# Patient Record
Sex: Female | Born: 1965 | Race: White | Hispanic: No | Marital: Married | Smoking: Former smoker
Health system: Southern US, Community
[De-identification: ages and names within clinical notes are randomized; demographics above are authoritative.]

## PROBLEM LIST (undated history)

## (undated) DIAGNOSIS — F329 Major depressive disorder, single episode, unspecified: Secondary | ICD-10-CM

## (undated) DIAGNOSIS — F32A Depression, unspecified: Secondary | ICD-10-CM

## (undated) DIAGNOSIS — N912 Amenorrhea, unspecified: Secondary | ICD-10-CM

## (undated) DIAGNOSIS — R112 Nausea with vomiting, unspecified: Secondary | ICD-10-CM

## (undated) DIAGNOSIS — K219 Gastro-esophageal reflux disease without esophagitis: Secondary | ICD-10-CM

## (undated) DIAGNOSIS — M1990 Unspecified osteoarthritis, unspecified site: Secondary | ICD-10-CM

## (undated) DIAGNOSIS — E785 Hyperlipidemia, unspecified: Secondary | ICD-10-CM

## (undated) DIAGNOSIS — Z9889 Other specified postprocedural states: Secondary | ICD-10-CM

## (undated) HISTORY — DX: Depression, unspecified: F32.A

## (undated) HISTORY — PX: BREAST REDUCTION SURGERY: SHX8

## (undated) HISTORY — PX: CARPAL TUNNEL RELEASE: SHX101

## (undated) HISTORY — DX: Amenorrhea, unspecified: N91.2

## (undated) HISTORY — DX: Major depressive disorder, single episode, unspecified: F32.9

---

## 1999-06-14 ENCOUNTER — Other Ambulatory Visit: Admission: RE | Admit: 1999-06-14 | Discharge: 1999-06-14 | Payer: Self-pay | Admitting: Gynecology

## 1999-09-25 HISTORY — PX: TUBAL LIGATION: SHX77

## 2000-01-01 ENCOUNTER — Inpatient Hospital Stay (HOSPITAL_COMMUNITY): Admission: AD | Admit: 2000-01-01 | Discharge: 2000-01-03 | Payer: Self-pay | Admitting: Gynecology

## 2000-01-01 ENCOUNTER — Encounter (INDEPENDENT_AMBULATORY_CARE_PROVIDER_SITE_OTHER): Payer: Self-pay | Admitting: Specialist

## 2000-02-20 ENCOUNTER — Other Ambulatory Visit: Admission: RE | Admit: 2000-02-20 | Discharge: 2000-02-20 | Payer: Self-pay | Admitting: Gynecology

## 2002-09-16 ENCOUNTER — Other Ambulatory Visit: Admission: RE | Admit: 2002-09-16 | Discharge: 2002-09-16 | Payer: Self-pay | Admitting: Gynecology

## 2004-10-27 ENCOUNTER — Other Ambulatory Visit: Admission: RE | Admit: 2004-10-27 | Discharge: 2004-10-27 | Payer: Self-pay | Admitting: Obstetrics and Gynecology

## 2006-06-06 ENCOUNTER — Other Ambulatory Visit: Admission: RE | Admit: 2006-06-06 | Discharge: 2006-06-06 | Payer: Self-pay | Admitting: Obstetrics and Gynecology

## 2007-11-05 ENCOUNTER — Other Ambulatory Visit: Admission: RE | Admit: 2007-11-05 | Discharge: 2007-11-05 | Payer: Self-pay | Admitting: Obstetrics and Gynecology

## 2009-06-07 ENCOUNTER — Emergency Department (HOSPITAL_COMMUNITY): Admission: EM | Admit: 2009-06-07 | Discharge: 2009-06-07 | Payer: Self-pay | Admitting: Emergency Medicine

## 2011-02-09 NOTE — Discharge Summary (Signed)
[facility]  Patient:    Tammy Herman, Tammy Herman                      MRN: [id number] Adm. Date:  [id number] Disc. Date: [id number] Attending:  Tonye Royalty Dictator:   Antony Contras, [facility]                           Discharge Summary  DISCHARGE DIAGNOSES:              Intrauterine pregnancy at 37 weeks. Delivery of viable infant.  PROCEDURES:                       Mighty-Vac assisted vaginal delivery. Without hematomas or laceration.  Modified Pomeroy bilateral tubal ligation.  HISTORY OF PRESENT ILLNESS:       The patient is a 45 year old gravida 3, para 2-0-0-2 with an LMP April 18, 1999 and [place] Jan 23, 2000.  PRENATAL COURSE:                  Uncomplicated.  PRENATAL LABS:                    Blood type 0 positive.  Antibody screen negative.  Rubella positive.  RPR, HBSAG, HIV nonreactive.  NSAFP within normal limits.  Glucose within normal limits.  The patient did decline genetic amniocentesis.  GBS negative.  HOSPITAL COURSE AND TREATMENT:    The patient was admitted at 37 weeks with spontaneous rupture of membranes of clear fluid.  Cervix on admission was 1 cm and 50% nonassist station.  Pitocin augmentation was begun.  The patient progressed in labor to complete dilatation and was delivered vaginally with Mighty-Vac assistance.  APGAR 9,9 female infant, weight 6 pounds 2 ounces.  No episiotomy and no lacerations.  There was a ping-pong sized hematoma on the right labia.  Estimated blood loss was 500 cc.                                    On her second postpartum day the patient was taken to surgery for a bilateral tubal ligation, performed by Dr. Douglass Rivers under epidural anesthesia.  Findings included normal tubes. small pedunculated uterine fibroids.                                    Her postpartum course was within normal limits.  She remained afebrile and no difficulty voiding after delivery or surgery.  The patient can be  discharged on her third day, in satisfactory condition with her infant.  DISPOSITION:                      Follow-up in the office in six weeks. Continue with Motrin and prenatal vitamins and iron.  DISCHARGE LABS:                   Postpartum CBC -- Hematocrit 32.8, hemoglobin 11.2, WBC 15, platelets 354. DD:  01/19/00 TD:  01/23/00 Job: [id number]

## 2011-02-09 NOTE — Op Note (Signed)
[facility]  Patient:    Tammy Herman, Tammy Herman                      MRN: [id number] Proc. Date: 01/02/00 Adm. Date:  [id number] Attending:  Tonye Royalty                           Operative Report  PREOPERATIVE DIAGNOSIS:       Status post NSVD, undesired fertility.  POSTOPERATIVE DIAGNOSIS:      Status post NSVD, undesired fertility.  OPERATION:                    Modified Pomeroy bilateral tubal ligation.  SURGEON:                      Douglass Rivers, M.D.  ASSISTANT:  ANESTHESIA:                   Epidural.  ESTIMATED BLOOD LOSS:         Minimal.  FINDINGS:                     Normal tubes bilaterally and a small pedunculated  fibroid at the right fundus.  DESCRIPTION OF PROCEDURE:     The patient was taken to the operating room after  informed consent and adequate epidural anesthesia was ensured and prepped and draped in the usual sterile fashion.  An infraumbilical incision was made with he scalpel and carried through to the underlying layer of fascia which was scored n the midline.  The incision was extended laterally with the Mayo scissors.  An examining finger was placed into the incision.  The tubo-ovarian ligament was grasped with a Babcock.  The tube was visualized, elevated through the incision, and walked out to the fimbriated end.  Two free ties were threwn around the midportion of the tube which was excised.  The tube was noted to be hemostatic nd was returned back to the abdomen.  In a similar fashion, the tubo-ovarian ligament on the left was grasped, the tube was visualized, and the midportion of the left tube was also excised.  The tubes were sent off to pathology.  The fascia and peritoneum were closed with 0 Vicryl.  The skin was closed with 3-0 plain. Steri-Strips were placed.  8 cc of Marcaine was injected into the incision. The patient tolerated the procedure well.  Sponge, needle, and instrument  counts were correct x 2.  She was transferred to the PACU in stable condition. DD:  01/02/00 TD:  01/02/00 Job: [id number]

## 2012-08-24 HISTORY — PX: KNEE SURGERY: SHX244

## 2013-06-30 ENCOUNTER — Encounter: Payer: Self-pay | Admitting: Nurse Practitioner

## 2013-07-01 ENCOUNTER — Ambulatory Visit: Payer: Self-pay | Admitting: Gynecology

## 2013-07-27 ENCOUNTER — Ambulatory Visit (INDEPENDENT_AMBULATORY_CARE_PROVIDER_SITE_OTHER): Payer: 59 | Admitting: Gynecology

## 2013-07-27 ENCOUNTER — Encounter: Payer: Self-pay | Admitting: Gynecology

## 2013-07-27 VITALS — BP 120/70 | HR 74 | Resp 16 | Ht 64.25 in | Wt 203.0 lb

## 2013-07-27 DIAGNOSIS — Z0000 Encounter for general adult medical examination without abnormal findings: Secondary | ICD-10-CM

## 2013-07-27 DIAGNOSIS — R635 Abnormal weight gain: Secondary | ICD-10-CM

## 2013-07-27 DIAGNOSIS — E7800 Pure hypercholesterolemia, unspecified: Secondary | ICD-10-CM | POA: Insufficient documentation

## 2013-07-27 DIAGNOSIS — Z01419 Encounter for gynecological examination (general) (routine) without abnormal findings: Secondary | ICD-10-CM

## 2013-07-27 LAB — POCT URINALYSIS DIPSTICK
Bilirubin, UA: NEGATIVE
Blood, UA: NEGATIVE
Glucose, UA: NEGATIVE
Ketones, UA: NEGATIVE
Leukocytes, UA: NEGATIVE
Nitrite, UA: NEGATIVE
Protein, UA: NEGATIVE
Urobilinogen, UA: NEGATIVE
pH, UA: 5

## 2013-07-27 LAB — TSH: TSH: 0.823 u[IU]/mL (ref 0.350–4.500)

## 2013-07-27 LAB — HEMOGLOBIN, FINGERSTICK: Hemoglobin, fingerstick: 12.9 g/dL (ref 12.0–16.0)

## 2013-07-27 NOTE — Patient Instructions (Signed)

## 2013-07-27 NOTE — Progress Notes (Signed)
47 y.o. MarriedCaucasian female   [phone] here for annual exam. Pt is currently sexually active.  No cycles since 11/2012,  Had hot flashes earlier but getting better.  No vaginal dryness.  Pt reports noticing blood with wiping only last month, red.   Pt was treated with provera last year due to prolonged absent menses and heavy cycles, pt states no benefit so stopped provera.  Pt has gained 20# since last office visit, does regular exercise.  Pt reports some bloating for 22m or so.  Patient's last menstrual period was 11/22/2012.          Sexually active: yes  The current method of family planning is tubal ligation.    Exercising: yes  Gym/ health club routine includes stationary bike and yoga. 3-4 days a weeek Last pap: 04/22/12 normal, neg HRHPV Alcohol: 5-6 glasses of wine a week Tobacco: quit smoking 7 years ago BSE:no Mammogram:      Health Maintenance  Topic Date Due  . Pap Smear  10/02/1983  . Tetanus/tdap  10/01/1984  . Influenza Vaccine  04/24/2013    Family History  Problem Relation Age of Onset  . Hypertension Mother   . Diabetes Father     Type 2  . Diabetes Sister     Type 2    There are no active problems to display for this patient.   Past Medical History  Diagnosis Date  . Depression   . Indigestion   . Amenorrhea     Past Surgical History  Procedure Laterality Date  . Tubal ligation  2001  . Knee surgery Left 08/2012    Allergies: Review of patient's allergies indicates no known allergies.  Current Outpatient Prescriptions  Medication Sig Dispense Refill  . rosuvastatin (CRESTOR) 10 MG tablet Take 10 mg by mouth daily.       No current facility-administered medications for this visit.    ROS: Pertinent items are noted in HPI.  Exam:    BP 120/70  Pulse 74  Resp 16  Ht 5' 4.25" (1.632 m)  Wt 203 lb (92.08 kg)  BMI 34.57 kg/m2  LMP 11/22/2012 Weight change: @WEIGHTCHANGE @ Last 3 height recordings:  Ht Readings from Last 3 Encounters:   07/27/13 5' 4.25" (1.632 m)   General appearance: alert, cooperative and appears stated age Head: Normocephalic, without obvious abnormality, atraumatic Neck: no adenopathy, no carotid bruit, no JVD, supple, symmetrical, trachea midline and thyroid not enlarged, symmetric, no tenderness/mass/nodules Lungs: clear to auscultation bilaterally Breasts: normal appearance, no masses or tenderness Heart: regular rate and rhythm, S1, S2 normal, no murmur, click, rub or gallop Abdomen: soft, non-tender; bowel sounds normal; no masses,  no organomegaly Extremities: extremities normal, atraumatic, no cyanosis or edema Skin: Skin color, texture, turgor normal. No rashes or lesions Lymph nodes: Cervical, supraclavicular, and axillary nodes normal. no inguinal nodes palpated Neurologic: Grossly normal   Pelvic: External genitalia:  no lesions              Urethra: normal appearing urethra with no masses, tenderness or lesions              Bartholins and Skenes: normal                 Vagina: normal appearing vagina with normal color and discharge, no lesions              Cervix: normal appearance              Pap taken: no  Bimanual Exam:  Uterus:  uterus is normal size, shape, consistency and nontender                                      Adnexa:    normal adnexa in size, nontender and no masses                                      Rectovaginal: Confirms                                      Anus:  normal sphincter tone, no lesions  A: well woman Peri-menopause Weight gain     P: mammogram annual pap smear not done TSH counseled on breast self exam, mammography screening, adequate intake of calcium and vitamin D, diet and exercise return annually or prn   An After Visit Summary was printed and given to the patient.

## 2013-07-30 ENCOUNTER — Other Ambulatory Visit: Payer: Self-pay

## 2014-07-26 ENCOUNTER — Encounter: Payer: Self-pay | Admitting: Gynecology

## 2014-09-03 ENCOUNTER — Telehealth: Payer: 59 | Admitting: Family

## 2014-09-03 DIAGNOSIS — J019 Acute sinusitis, unspecified: Secondary | ICD-10-CM

## 2014-09-03 MED ORDER — AMOXICILLIN-POT CLAVULANATE 875-125 MG PO TABS
1.0000 | ORAL_TABLET | Freq: Two times a day (BID) | ORAL | Status: DC
Start: 2014-09-03 — End: 2014-10-19

## 2014-09-03 NOTE — Progress Notes (Signed)

## 2014-10-06 NOTE — H&P (Signed)
UNICOMPARTMENTAL KNEE ADMISSION H&P  Patient is being admitted for right knee lateral menisectomy and medial unicompartmental arthroplasty.  Subjective:  Chief Complaint:     Right knee LMT and medial compartment primary OA / pain.  HPI: Juli Odom Fennimore, 49 y.o. female, has a history of pain and functional disability in the right knee due to arthritis and has failed non-surgical conservative treatments for greater than 12 weeks to include NSAID's and/or analgesics, corticosteriod injections and activity modification.  Onset of symptoms was gradual, starting 1+ years ago with gradually worsening course since that time. The patient noted no past surgery on the right knee(s).  Patient currently rates pain in the right knee(s) at 10 out of 10 with activity. Patient has night pain, worsening of pain with activity and weight bearing, pain that interferes with activities of daily living, pain with passive range of motion, crepitus and joint swelling.  Patient has evidence of periarticular osteophytes and joint space narrowing of the medial compartment by imaging studies. MRI also reveals right knee lateral meniscal tear.  There is no active infection.   Risks, benefits and expectations were discussed with the patient.  Risks including but not limited to the risk of anesthesia, blood clots, nerve damage, blood vessel damage, failure of the prosthesis, infection and up to and including death.  Patient understand the risks, benefits and expectations and wishes to proceed with surgery.   PCP: Tivis Ringer, MD  D/C Plans:      Home with HHPT  Post-op Meds:       No Rx given   Tranexamic Acid:      To be given - IV   Decadron:      Is to be given  FYI:     ASA post-op  Norco post-op    Patient Active Problem List   Diagnosis Date Noted  . Pure hypercholesterolemia 07/27/2013   Past Medical History  Diagnosis Date  . Depression   . Indigestion   . Amenorrhea     Past Surgical History   Procedure Laterality Date  . Tubal ligation  2001  . Knee surgery Left 08/2012    No prescriptions prior to admission   No Known Allergies   History  Substance Use Topics  . Smoking status: Former Smoker    Quit date: 07/27/2006  . Smokeless tobacco: Never Used  . Alcohol Use: 2.5 - 3.0 oz/week    5-6 drink(s) per week     Comment: 5-6 glasses of wine a week    Family History  Problem Relation Age of Onset  . Hypertension Mother   . Diabetes Father     Type 2  . Diabetes Sister     Type 2     Review of Systems  Constitutional: Negative.   HENT: Negative.   Eyes: Negative.   Respiratory: Negative.   Cardiovascular: Negative.   Gastrointestinal: Positive for heartburn.  Genitourinary: Negative.   Musculoskeletal: Positive for joint pain.  Skin: Negative.   Neurological: Negative.   Endo/Heme/Allergies: Negative.   Psychiatric/Behavioral: Positive for depression.    Objective:  Physical Exam  Constitutional: She is oriented to person, place, and time. She appears well-developed and well-nourished.  HENT:  Head: Normocephalic and atraumatic.  Mouth/Throat: Oropharynx is clear and moist.  Eyes: Pupils are equal, round, and reactive to light.  Neck: Neck supple. No JVD present. No tracheal deviation present. No thyromegaly present.  Cardiovascular: Normal rate, regular rhythm, normal heart sounds and intact distal pulses.  Respiratory: Effort normal and breath sounds normal. No stridor. No respiratory distress. She has no wheezes.  GI: Soft. There is no tenderness. There is no guarding.  Musculoskeletal:       Right knee: She exhibits decreased range of motion, swelling and bony tenderness. She exhibits no ecchymosis, no deformity, no laceration and no erythema. Tenderness found. Medial joint line and lateral joint line tenderness noted.  Lymphadenopathy:    She has no cervical adenopathy.  Neurological: She is alert and oriented to person, place, and time.   Skin: Skin is warm and dry.  Psychiatric: She has a normal mood and affect.     Labs:  Estimated body mass index is 34.57 kg/(m^2) as calculated from the following:   Height as of 07/27/13: 5' 4.25" (1.632 m).   Weight as of 07/27/13: 92.08 kg (203 lb).   Imaging Review Plain radiographs demonstrate severe degenerative joint disease of the right knee medial compartment. MRI also reveals LMT.  The overall alignment is neutral. The bone quality appears to be good for age and reported activity level.  Assessment/Plan:  End stage arthritis, right knee   The patient history, physical examination, clinical judgment of the provider and imaging studies are consistent with end stage degenerative joint disease and lateral meniscal tear of the right knee and knee arthroscopy with lateral menisectomy and medial unicompartmental knee arthroplasty is deemed medically necessary. The treatment options including medical management, injection therapy arthroscopy and arthroplasty were discussed at length. The risks and benefits of total knee arthroplasty were presented and reviewed. The risks due to aseptic loosening, infection, stiffness, patella tracking problems, thromboembolic complications and other imponderables were discussed. The patient acknowledged the explanation, agreed to proceed with the plan and consent was signed. Patient is being admitted for observation treatment for surgery, pain control, PT, OT, prophylactic antibiotics, VTE prophylaxis, progressive ambulation and ADL's and discharge planning. The patient is planning to be discharged home with home health services.       West Pugh Tareq Dwan   PA-C  10/06/2014, 12:45 PM

## 2014-10-11 NOTE — Patient Instructions (Addendum)
Lalania Haseman Buser  10/11/2014   Your procedure is scheduled on: [id number]    Report to [facility] Main  Entrance and follow signs to               [place] at       [place] AM.  Call this number if you have problems the morning of surgery [phone]   Remember:  Do not eat food or drink liquids :After Midnight.     Take these medicines the morning of surgery with A SIP OF WATER: Zantac, Effexor, Hydrocodone                                You may not have any metal on your body including hair pins and              piercings  Do not wear jewelry, make-up, lotions, powders or perfumes., deodorant             Do not wear nail polish.  Do not shave  48 hours prior to surgery.                 Do not bring valuables to the hospital. [place].  Contacts, dentures or bridgework may not be worn into surgery.  Leave suitcase in the car. After surgery it may be brought to your room.         Special Instructions:coughing and deep breathing exercises, leg exercises               Please read over the following fact sheets you were given: _____________________________________________________________________             [facility] - Preparing for Surgery Before surgery, you can play an important role.  Because skin is not sterile, your skin needs to be as free of germs as possible.  You can reduce the number of germs on your skin by washing with CHG (chlorahexidine gluconate) soap before surgery.  CHG is an antiseptic cleaner which kills germs and bonds with the skin to continue killing germs even after washing. Please DO NOT use if you have an allergy to CHG or antibacterial soaps.  If your skin becomes reddened/irritated stop using the CHG and inform your nurse when you arrive at Short Stay. Do not shave (including legs and underarms) for at least 48 hours prior to the first CHG shower.  You may shave your  face/neck. Please follow these instructions carefully:  1.  Shower with CHG Soap the night before surgery and the  morning of Surgery.  2.  If you choose to wash your hair, wash your hair first as usual with your  normal  shampoo.  3.  After you shampoo, rinse your hair and body thoroughly to remove the  shampoo.                           4.  Use CHG as you would any other liquid soap.  You can apply chg directly  to the skin and wash                       Gently with a scrungie or clean washcloth.  5.  Apply the CHG Soap to your body ONLY FROM THE NECK DOWN.   Do not use on face/ open                           Wound or open sores. Avoid contact with eyes, ears mouth and genitals (private parts).                       Wash face,  Genitals (private parts) with your normal soap.             6.  Wash thoroughly, paying special attention to the area where your surgery  will be performed.  7.  Thoroughly rinse your body with warm water from the neck down.  8.  DO NOT shower/wash with your normal soap after using and rinsing off  the CHG Soap.                9.  Pat yourself dry with a clean towel.            10.  Wear clean pajamas.            11.  Place clean sheets on your bed the night of your first shower and do not  sleep with pets. Day of Surgery : Do not apply any lotions/deodorants the morning of surgery.  Please wear clean clothes to the hospital/surgery center.  FAILURE TO FOLLOW THESE INSTRUCTIONS MAY RESULT IN THE CANCELLATION OF YOUR SURGERY PATIENT SIGNATURE_________________________________  NURSE SIGNATURE__________________________________  ________________________________________________________________________  WHAT IS A BLOOD TRANSFUSION? Blood Transfusion Information  A transfusion is the replacement of blood or some of its parts. Blood is made up of multiple cells which provide different functions.  Red blood cells carry oxygen and are used for blood loss  replacement.  White blood cells fight against infection.  Platelets control bleeding.  Plasma helps clot blood.  Other blood products are available for specialized needs, such as hemophilia or other clotting disorders. BEFORE THE TRANSFUSION  Who gives blood for transfusions?   Healthy volunteers who are fully evaluated to make sure their blood is safe. This is blood bank blood. Transfusion therapy is the safest it has ever been in the practice of medicine. Before blood is taken from a donor, a complete history is taken to make sure that person has no history of diseases nor engages in risky social behavior (examples are intravenous drug use or sexual activity with multiple partners). The donor's travel history is screened to minimize risk of transmitting infections, such as malaria. The donated blood is tested for signs of infectious diseases, such as HIV and hepatitis. The blood is then tested to be sure it is compatible with you in order to minimize the chance of a transfusion reaction. If you or a relative donates blood, this is often done in anticipation of surgery and is not appropriate for emergency situations. It takes many days to process the donated blood. RISKS AND COMPLICATIONS Although transfusion therapy is very safe and saves many lives, the main dangers of transfusion include:  1. Getting an infectious disease. 2. Developing a transfusion reaction. This is an allergic reaction to something in the blood you were given. Every precaution is taken to prevent this. The decision to have a blood transfusion has been considered carefully by your caregiver before blood is given. Blood is not given unless the benefits outweigh the risks. AFTER THE TRANSFUSION  Right after  receiving a blood transfusion, you will usually feel much better and more energetic. This is especially true if your red blood cells have gotten low (anemic). The transfusion raises the level of the red blood cells which  carry oxygen, and this usually causes an energy increase.  The nurse administering the transfusion will monitor you carefully for complications. HOME CARE INSTRUCTIONS  No special instructions are needed after a transfusion. You may find your energy is better. Speak with your caregiver about any limitations on activity for underlying diseases you may have. SEEK MEDICAL CARE IF:   Your condition is not improving after your transfusion.  You develop redness or irritation at the intravenous (IV) site. SEEK IMMEDIATE MEDICAL CARE IF:  Any of the following symptoms occur over the next 12 hours:  Shaking chills.  You have a temperature by mouth above 102 F (38.9 C), not controlled by medicine.  Chest, back, or muscle pain.  People around you feel you are not acting correctly or are confused.  Shortness of breath or difficulty breathing.  Dizziness and fainting.  You get a rash or develop hives.  You have a decrease in urine output.  Your urine turns a dark color or changes to pink, red, or brown. Any of the following symptoms occur over the next 10 days:  You have a temperature by mouth above 102 F (38.9 C), not controlled by medicine.  Shortness of breath.  Weakness after normal activity.  The white part of the eye turns yellow (jaundice).  You have a decrease in the amount of urine or are urinating less often.  Your urine turns a dark color or changes to pink, red, or brown. Document Released: 09/07/2000 Document Revised: 12/03/2011 Document Reviewed: 04/26/2008 ExitCare Patient Information 2014 [place].  _______________________________________________________________________  Incentive Spirometer  An incentive spirometer is a tool that can help keep your lungs clear and active. This tool measures how well you are filling your lungs with each breath. Taking long deep breaths may help reverse or decrease the chance of developing breathing (pulmonary) problems  (especially infection) following:  A long period of time when you are unable to move or be active. BEFORE THE PROCEDURE   If the spirometer includes an indicator to show your best effort, your nurse or respiratory therapist will set it to a desired goal.  If possible, sit up straight or lean slightly forward. Try not to slouch.  Hold the incentive spirometer in an upright position. INSTRUCTIONS FOR USE  3. Sit on the edge of your bed if possible, or sit up as far as you can in bed or on a chair. 4. Hold the incentive spirometer in an upright position. 5. Breathe out normally. 6. Place the mouthpiece in your mouth and seal your lips tightly around it. 7. Breathe in slowly and as deeply as possible, raising the piston or the ball toward the top of the column. 8. Hold your breath for 3-5 seconds or for as long as possible. Allow the piston or ball to fall to the bottom of the column. 9. Remove the mouthpiece from your mouth and breathe out normally. 10. Rest for a few seconds and repeat Steps 1 through 7 at least 10 times every 1-2 hours when you are awake. Take your time and take a few normal breaths between deep breaths. 11. The spirometer may include an indicator to show your best effort. Use the indicator as a goal to work toward during each repetition. 12. After each set  of 10 deep breaths, practice coughing to be sure your lungs are clear. If you have an incision (the cut made at the time of surgery), support your incision when coughing by placing a pillow or rolled up towels firmly against it. Once you are able to get out of bed, walk around indoors and cough well. You may stop using the incentive spirometer when instructed by your caregiver.  RISKS AND COMPLICATIONS  Take your time so you do not get dizzy or light-headed.  If you are in pain, you may need to take or ask for pain medication before doing incentive spirometry. It is harder to take a deep breath if you are having  pain. AFTER USE  Rest and breathe slowly and easily.  It can be helpful to keep track of a log of your progress. Your caregiver can provide you with a simple table to help with this. If you are using the spirometer at home, follow these instructions: [place] IF:   You are having difficultly using the spirometer.  You have trouble using the spirometer as often as instructed.  Your pain medication is not giving enough relief while using the spirometer.  You develop fever of 100.5 F (38.1 C) or higher. SEEK IMMEDIATE MEDICAL CARE IF:   You cough up bloody sputum that had not been present before.  You develop fever of 102 F (38.9 C) or greater.  You develop worsening pain at or near the incision site. MAKE SURE YOU:   Understand these instructions.  Will watch your condition.  Will get help right away if you are not doing well or get worse. Document Released: 01/21/2007 Document Revised: 12/03/2011 Document Reviewed: 03/24/2007 [facility] Patient Information 2014 [place], [place].   ________________________________________________________________________

## 2014-10-13 ENCOUNTER — Encounter (HOSPITAL_COMMUNITY): Payer: Self-pay

## 2014-10-13 ENCOUNTER — Encounter (HOSPITAL_COMMUNITY)
Admission: RE | Admit: 2014-10-13 | Discharge: 2014-10-13 | Disposition: A | Discharge status: Home / Self Care | Payer: Commercial Managed Care - PPO | Source: Ambulatory Visit | Attending: Orthopedic Surgery | Admitting: Orthopedic Surgery

## 2014-10-13 DIAGNOSIS — S83281A Other tear of lateral meniscus, current injury, right knee, initial encounter: Secondary | ICD-10-CM | POA: Insufficient documentation

## 2014-10-13 DIAGNOSIS — M179 Osteoarthritis of knee, unspecified: Secondary | ICD-10-CM | POA: Diagnosis not present

## 2014-10-13 DIAGNOSIS — Z01818 Encounter for other preprocedural examination: Secondary | ICD-10-CM | POA: Insufficient documentation

## 2014-10-13 HISTORY — DX: Gastro-esophageal reflux disease without esophagitis: K21.9

## 2014-10-13 HISTORY — DX: Unspecified osteoarthritis, unspecified site: M19.90

## 2014-10-13 LAB — CBC
HCT: 42.2 % (ref 36.0–46.0)
Hemoglobin: 13.5 g/dL (ref 12.0–15.0)
MCH: 29.5 pg (ref 26.0–34.0)
MCHC: 32 g/dL (ref 30.0–36.0)
MCV: 92.1 fL (ref 78.0–100.0)
Platelets: 332 10*3/uL (ref 150–400)
RBC: 4.58 MIL/uL (ref 3.87–5.11)
RDW: 13.8 % (ref 11.5–15.5)
WBC: 6.2 10*3/uL (ref 4.0–10.5)

## 2014-10-13 LAB — URINALYSIS, ROUTINE W REFLEX MICROSCOPIC
Bilirubin Urine: NEGATIVE
Glucose, UA: NEGATIVE mg/dL
Hgb urine dipstick: NEGATIVE
Ketones, ur: NEGATIVE mg/dL
Nitrite: NEGATIVE
Protein, ur: NEGATIVE mg/dL
Specific Gravity, Urine: 1.03 (ref 1.005–1.030)
Urobilinogen, UA: 0.2 mg/dL (ref 0.0–1.0)
pH: 5 (ref 5.0–8.0)

## 2014-10-13 LAB — PROTIME-INR
INR: 0.94 (ref 0.00–1.49)
Prothrombin Time: 12.6 seconds (ref 11.6–15.2)

## 2014-10-13 LAB — SURGICAL PCR SCREEN
MRSA, PCR: NEGATIVE
Staphylococcus aureus: POSITIVE — AB

## 2014-10-13 LAB — URINE MICROSCOPIC-ADD ON

## 2014-10-13 LAB — BASIC METABOLIC PANEL
Anion gap: 7 (ref 5–15)
BUN: 14 mg/dL (ref 6–23)
CO2: 25 mmol/L (ref 19–32)
Calcium: 8.9 mg/dL (ref 8.4–10.5)
Chloride: 104 mEq/L (ref 96–112)
Creatinine, Ser: 0.81 mg/dL (ref 0.50–1.10)
GFR calc Af Amer: >90 mL/min (ref >90)
GFR calc non Af Amer: 84 mL/min — ABNORMAL LOW (ref >90)
Glucose, Bld: 85 mg/dL (ref 70–99)
Potassium: 4.5 mmol/L (ref 3.5–5.1)
Sodium: 136 mmol/L (ref 135–145)

## 2014-10-13 LAB — PREGNANCY, URINE: Preg Test, Ur: NEGATIVE

## 2014-10-13 LAB — APTT: aPTT: 30 seconds (ref 24–37)

## 2014-10-13 LAB — ABO/RH: ABO/RH(D): O POS

## 2014-10-18 ENCOUNTER — Ambulatory Visit (HOSPITAL_COMMUNITY): Payer: Commercial Managed Care - PPO | Admitting: Certified Registered"

## 2014-10-18 ENCOUNTER — Observation Stay (HOSPITAL_COMMUNITY)
Admission: RE | Admit: 2014-10-18 | Discharge: 2014-10-19 | Disposition: A | Discharge status: Home / Self Care | Payer: Commercial Managed Care - PPO | Source: Ambulatory Visit | Attending: Orthopedic Surgery | Admitting: Orthopedic Surgery

## 2014-10-18 ENCOUNTER — Encounter (HOSPITAL_COMMUNITY): Payer: Self-pay | Admitting: Certified Registered"

## 2014-10-18 ENCOUNTER — Encounter (HOSPITAL_COMMUNITY)
Admission: RE | Disposition: A | Discharge status: Home / Self Care | Payer: Self-pay | Source: Ambulatory Visit | Attending: Orthopedic Surgery

## 2014-10-18 DIAGNOSIS — X58XXXA Exposure to other specified factors, initial encounter: Secondary | ICD-10-CM | POA: Diagnosis not present

## 2014-10-18 DIAGNOSIS — M1711 Unilateral primary osteoarthritis, right knee: Secondary | ICD-10-CM | POA: Diagnosis not present

## 2014-10-18 DIAGNOSIS — Y9389 Activity, other specified: Secondary | ICD-10-CM | POA: Insufficient documentation

## 2014-10-18 DIAGNOSIS — Y998 Other external cause status: Secondary | ICD-10-CM | POA: Diagnosis not present

## 2014-10-18 DIAGNOSIS — K219 Gastro-esophageal reflux disease without esophagitis: Secondary | ICD-10-CM | POA: Diagnosis not present

## 2014-10-18 DIAGNOSIS — Z9889 Other specified postprocedural states: Secondary | ICD-10-CM

## 2014-10-18 DIAGNOSIS — Y9289 Other specified places as the place of occurrence of the external cause: Secondary | ICD-10-CM | POA: Insufficient documentation

## 2014-10-18 DIAGNOSIS — E780 Pure hypercholesterolemia: Secondary | ICD-10-CM | POA: Insufficient documentation

## 2014-10-18 DIAGNOSIS — Z96659 Presence of unspecified artificial knee joint: Secondary | ICD-10-CM

## 2014-10-18 DIAGNOSIS — Z87891 Personal history of nicotine dependence: Secondary | ICD-10-CM | POA: Insufficient documentation

## 2014-10-18 DIAGNOSIS — S83281A Other tear of lateral meniscus, current injury, right knee, initial encounter: Secondary | ICD-10-CM | POA: Diagnosis not present

## 2014-10-18 DIAGNOSIS — Z96651 Presence of right artificial knee joint: Secondary | ICD-10-CM

## 2014-10-18 DIAGNOSIS — F329 Major depressive disorder, single episode, unspecified: Secondary | ICD-10-CM | POA: Insufficient documentation

## 2014-10-18 HISTORY — PX: PARTIAL KNEE ARTHROPLASTY: SHX2174

## 2014-10-18 HISTORY — PX: KNEE ARTHROSCOPY WITH LATERAL MENISECTOMY: SHX6193

## 2014-10-18 LAB — TYPE AND SCREEN
ABO/RH(D): O POS
Antibody Screen: NEGATIVE

## 2014-10-18 SURGERY — ARTHROSCOPY, KNEE, WITH LATERAL MENISCECTOMY
Anesthesia: Spinal | Site: Knee | Laterality: Right

## 2014-10-18 MED ORDER — ALUM & MAG HYDROXIDE-SIMETH 200-200-20 MG/5ML PO SUSP: 30.0000 mL | ORAL | Status: DC | PRN

## 2014-10-18 MED ORDER — FERROUS SULFATE 325 (65 FE) MG PO TABS
325.0000 mg | ORAL_TABLET | Freq: Three times a day (TID) | ORAL | Status: DC
  Filled 2014-10-18 (×5): qty 1

## 2014-10-18 MED ORDER — METHOCARBAMOL 500 MG PO TABS
500.0000 mg | ORAL_TABLET | Freq: Four times a day (QID) | ORAL | Status: DC | PRN
  Administered 2014-10-18 – 2014-10-19 (×3): 500 mg via ORAL
  Filled 2014-10-18 (×3): qty 1

## 2014-10-18 MED ORDER — BUPIVACAINE-EPINEPHRINE 0.25% -1:200000 IJ SOLN
INTRAMUSCULAR | Status: DC | PRN
  Administered 2014-10-18: 30 mL

## 2014-10-18 MED ORDER — LIDOCAINE HCL (CARDIAC) 20 MG/ML IV SOLN
INTRAVENOUS | Status: DC | PRN
  Administered 2014-10-18: 50 mg via INTRAVENOUS

## 2014-10-18 MED ORDER — TRAZODONE HCL 100 MG PO TABS
100.0000 mg | ORAL_TABLET | Freq: Every day | ORAL | Status: DC
  Administered 2014-10-18: 100 mg via ORAL
  Filled 2014-10-18 (×2): qty 1

## 2014-10-18 MED ORDER — LIDOCAINE HCL (CARDIAC) 20 MG/ML IV SOLN
INTRAVENOUS | Status: AC
  Filled 2014-10-18: qty 5

## 2014-10-18 MED ORDER — SODIUM CHLORIDE 0.9 % IV SOLN
INTRAVENOUS | Status: DC
  Administered 2014-10-18 – 2014-10-19 (×2): via INTRAVENOUS
  Filled 2014-10-18 (×5): qty 1000

## 2014-10-18 MED ORDER — PROMETHAZINE HCL 25 MG/ML IJ SOLN: 6.2500 mg | INTRAMUSCULAR | Status: DC | PRN

## 2014-10-18 MED ORDER — MEPERIDINE HCL 50 MG/ML IJ SOLN: 6.2500 mg | INTRAMUSCULAR | Status: DC | PRN

## 2014-10-18 MED ORDER — SODIUM CHLORIDE 0.9 % IJ SOLN
INTRAMUSCULAR | Status: DC | PRN
  Administered 2014-10-18: 30 mL

## 2014-10-18 MED ORDER — MENTHOL 3 MG MT LOZG
1.0000 | LOZENGE | OROMUCOSAL | Status: DC | PRN
  Filled 2014-10-18: qty 9

## 2014-10-18 MED ORDER — CELECOXIB 200 MG PO CAPS
200.0000 mg | ORAL_CAPSULE | Freq: Two times a day (BID) | ORAL | Status: DC
  Administered 2014-10-18 – 2014-10-19 (×2): 200 mg via ORAL
  Filled 2014-10-18 (×3): qty 1

## 2014-10-18 MED ORDER — KETOROLAC TROMETHAMINE 30 MG/ML IJ SOLN
INTRAMUSCULAR | Status: DC | PRN
  Administered 2014-10-18: 30 mg

## 2014-10-18 MED ORDER — 0.9 % SODIUM CHLORIDE (POUR BTL) OPTIME
TOPICAL | Status: DC | PRN
  Administered 2014-10-18: 1000 mL

## 2014-10-18 MED ORDER — CEFAZOLIN SODIUM-DEXTROSE 2-3 GM-% IV SOLR
INTRAVENOUS | Status: AC
  Filled 2014-10-18: qty 50

## 2014-10-18 MED ORDER — MIDAZOLAM HCL 5 MG/5ML IJ SOLN
INTRAMUSCULAR | Status: DC | PRN
  Administered 2014-10-18 (×2): 1 mg via INTRAVENOUS
  Administered 2014-10-18: 2 mg via INTRAVENOUS

## 2014-10-18 MED ORDER — DEXAMETHASONE SODIUM PHOSPHATE 10 MG/ML IJ SOLN
INTRAMUSCULAR | Status: AC
  Filled 2014-10-18: qty 1

## 2014-10-18 MED ORDER — FENTANYL CITRATE 0.05 MG/ML IJ SOLN
INTRAMUSCULAR | Status: AC
  Filled 2014-10-18: qty 2

## 2014-10-18 MED ORDER — PROPOFOL INFUSION 10 MG/ML OPTIME
INTRAVENOUS | Status: DC | PRN
  Administered 2014-10-18: 120 ug/kg/min via INTRAVENOUS

## 2014-10-18 MED ORDER — PROPOFOL 10 MG/ML IV BOLUS
INTRAVENOUS | Status: AC
  Filled 2014-10-18: qty 20

## 2014-10-18 MED ORDER — BUPIVACAINE IN DEXTROSE 0.75-8.25 % IT SOLN
INTRATHECAL | Status: DC | PRN
  Administered 2014-10-18: 1.8 mL via INTRATHECAL

## 2014-10-18 MED ORDER — SODIUM CHLORIDE 0.9 % IJ SOLN
INTRAMUSCULAR | Status: AC
  Filled 2014-10-18: qty 50

## 2014-10-18 MED ORDER — FENTANYL CITRATE 0.05 MG/ML IJ SOLN
INTRAMUSCULAR | Status: DC | PRN
  Administered 2014-10-18: 100 ug via INTRAVENOUS

## 2014-10-18 MED ORDER — MIDAZOLAM HCL 2 MG/2ML IJ SOLN
INTRAMUSCULAR | Status: AC
  Filled 2014-10-18: qty 2

## 2014-10-18 MED ORDER — ONDANSETRON HCL 4 MG/2ML IJ SOLN
INTRAMUSCULAR | Status: DC | PRN
  Administered 2014-10-18: 4 mg via INTRAVENOUS

## 2014-10-18 MED ORDER — PHENOL 1.4 % MT LIQD
1.0000 | OROMUCOSAL | Status: DC | PRN
  Filled 2014-10-18: qty 177

## 2014-10-18 MED ORDER — CHLORHEXIDINE GLUCONATE 4 % EX LIQD: 60.0000 mL | Freq: Once | CUTANEOUS | Status: DC

## 2014-10-18 MED ORDER — CEFAZOLIN SODIUM-DEXTROSE 2-3 GM-% IV SOLR
2.0000 g | Freq: Four times a day (QID) | INTRAVENOUS | Status: AC
  Administered 2014-10-18 (×2): 2 g via INTRAVENOUS
  Filled 2014-10-18 (×2): qty 50

## 2014-10-18 MED ORDER — LACTATED RINGERS IR SOLN
Status: DC | PRN
  Administered 2014-10-18 (×2): 3000 mL

## 2014-10-18 MED ORDER — ONDANSETRON HCL 4 MG/2ML IJ SOLN
4.0000 mg | Freq: Four times a day (QID) | INTRAMUSCULAR | Status: DC | PRN
  Administered 2014-10-18: 4 mg via INTRAVENOUS
  Filled 2014-10-18: qty 2

## 2014-10-18 MED ORDER — VENLAFAXINE HCL 75 MG PO TABS
75.0000 mg | ORAL_TABLET | Freq: Every morning | ORAL | Status: DC
  Administered 2014-10-18 – 2014-10-19 (×2): 75 mg via ORAL
  Filled 2014-10-18 (×2): qty 1

## 2014-10-18 MED ORDER — LACTATED RINGERS IV SOLN: INTRAVENOUS | Status: DC

## 2014-10-18 MED ORDER — ONDANSETRON HCL 4 MG PO TABS: 4.0000 mg | ORAL_TABLET | Freq: Four times a day (QID) | ORAL | Status: DC | PRN

## 2014-10-18 MED ORDER — HYDROMORPHONE HCL 1 MG/ML IJ SOLN: 0.2500 mg | INTRAMUSCULAR | Status: DC | PRN

## 2014-10-18 MED ORDER — DIPHENHYDRAMINE HCL 25 MG PO CAPS: 25.0000 mg | ORAL_CAPSULE | Freq: Four times a day (QID) | ORAL | Status: DC | PRN

## 2014-10-18 MED ORDER — FAMOTIDINE 20 MG PO TABS
20.0000 mg | ORAL_TABLET | Freq: Every day | ORAL | Status: DC
Start: 2014-10-19 — End: 2014-10-19
  Filled 2014-10-18: qty 1

## 2014-10-18 MED ORDER — HYDROMORPHONE HCL 1 MG/ML IJ SOLN
0.5000 mg | INTRAMUSCULAR | Status: DC | PRN
  Administered 2014-10-18 (×3): 1 mg via INTRAVENOUS
  Filled 2014-10-18 (×3): qty 1

## 2014-10-18 MED ORDER — ROSUVASTATIN CALCIUM 10 MG PO TABS
10.0000 mg | ORAL_TABLET | Freq: Every day | ORAL | Status: DC
  Administered 2014-10-18 – 2014-10-19 (×2): 10 mg via ORAL
  Filled 2014-10-18 (×2): qty 1

## 2014-10-18 MED ORDER — DEXAMETHASONE SODIUM PHOSPHATE 10 MG/ML IJ SOLN: 10.0000 mg | Freq: Once | INTRAMUSCULAR | Status: DC

## 2014-10-18 MED ORDER — METHOCARBAMOL 1000 MG/10ML IJ SOLN
500.0000 mg | Freq: Four times a day (QID) | INTRAVENOUS | Status: DC | PRN
  Filled 2014-10-18: qty 5

## 2014-10-18 MED ORDER — POLYETHYLENE GLYCOL 3350 17 G PO PACK
17.0000 g | PACK | Freq: Two times a day (BID) | ORAL | Status: DC
  Administered 2014-10-18 – 2014-10-19 (×2): 17 g via ORAL

## 2014-10-18 MED ORDER — DEXAMETHASONE SODIUM PHOSPHATE 10 MG/ML IJ SOLN
10.0000 mg | Freq: Once | INTRAMUSCULAR | Status: AC
  Administered 2014-10-18: 10 mg via INTRAVENOUS

## 2014-10-18 MED ORDER — METOCLOPRAMIDE HCL 5 MG/ML IJ SOLN: 5.0000 mg | Freq: Three times a day (TID) | INTRAMUSCULAR | Status: DC | PRN

## 2014-10-18 MED ORDER — CEFAZOLIN SODIUM-DEXTROSE 2-3 GM-% IV SOLR
2.0000 g | INTRAVENOUS | Status: AC
  Administered 2014-10-18: 2 g via INTRAVENOUS

## 2014-10-18 MED ORDER — BISACODYL 10 MG RE SUPP: 10.0000 mg | Freq: Every day | RECTAL | Status: DC | PRN

## 2014-10-18 MED ORDER — METOCLOPRAMIDE HCL 5 MG PO TABS
5.0000 mg | ORAL_TABLET | Freq: Three times a day (TID) | ORAL | Status: DC | PRN
  Filled 2014-10-18: qty 2

## 2014-10-18 MED ORDER — ASPIRIN EC 325 MG PO TBEC
325.0000 mg | DELAYED_RELEASE_TABLET | Freq: Every day | ORAL | Status: DC
  Administered 2014-10-19: 325 mg via ORAL
  Filled 2014-10-18 (×2): qty 1

## 2014-10-18 MED ORDER — KETOROLAC TROMETHAMINE 30 MG/ML IJ SOLN
INTRAMUSCULAR | Status: AC
Start: 2014-10-18 — End: 2014-10-18
  Filled 2014-10-18: qty 1

## 2014-10-18 MED ORDER — TRANEXAMIC ACID 100 MG/ML IV SOLN
1000.0000 mg | Freq: Once | INTRAVENOUS | Status: AC
  Administered 2014-10-18: 1000 mg via INTRAVENOUS
  Filled 2014-10-18: qty 10

## 2014-10-18 MED ORDER — HYDROCODONE-ACETAMINOPHEN 7.5-325 MG PO TABS
1.0000 | ORAL_TABLET | ORAL | Status: DC
  Administered 2014-10-18: 1 via ORAL
  Administered 2014-10-18 – 2014-10-19 (×5): 2 via ORAL
  Filled 2014-10-18: qty 1
  Filled 2014-10-18 (×5): qty 2

## 2014-10-18 MED ORDER — ONDANSETRON HCL 4 MG/2ML IJ SOLN
INTRAMUSCULAR | Status: AC
  Filled 2014-10-18: qty 2

## 2014-10-18 MED ORDER — DOCUSATE SODIUM 100 MG PO CAPS
100.0000 mg | ORAL_CAPSULE | Freq: Two times a day (BID) | ORAL | Status: DC
  Administered 2014-10-18 – 2014-10-19 (×2): 100 mg via ORAL

## 2014-10-18 MED ORDER — LACTATED RINGERS IV SOLN
INTRAVENOUS | Status: DC | PRN
  Administered 2014-10-18 (×3): via INTRAVENOUS

## 2014-10-18 MED ORDER — BUPIVACAINE-EPINEPHRINE (PF) 0.25% -1:200000 IJ SOLN
INTRAMUSCULAR | Status: AC
  Filled 2014-10-18: qty 30

## 2014-10-18 MED ORDER — MAGNESIUM CITRATE PO SOLN: 1.0000 | Freq: Once | ORAL | Status: AC | PRN

## 2014-10-18 SURGICAL SUPPLY — 68 items
BAG SPEC THK2 15X12 ZIP CLS (MISCELLANEOUS) ×1
BAG ZIPLOCK 12X15 (MISCELLANEOUS) ×2 IMPLANT
BANDAGE ELASTIC 6 VELCRO ST LF (GAUZE/BANDAGES/DRESSINGS) ×2 IMPLANT
BANDAGE ESMARK 6X9 LF (GAUZE/BANDAGES/DRESSINGS) ×1 IMPLANT
BLADE CUDA SHAVER 3.5 (BLADE) ×2 IMPLANT
BLADE SAW RECIPROCATING 77.5 (BLADE) ×2 IMPLANT
BLADE SAW SGTL 13.0X1.19X90.0M (BLADE) ×2 IMPLANT
BLADE SURG SZ11 CARB STEEL (BLADE) IMPLANT
BNDG CMPR 9X6 STRL LF SNTH (GAUZE/BANDAGES/DRESSINGS) ×1
BNDG ESMARK 6X9 LF (GAUZE/BANDAGES/DRESSINGS) ×2
BOWL SMART MIX CTS (DISPOSABLE) ×2 IMPLANT
CAPT KNEE PARTIAL 2 ×2 IMPLANT
CEMENT HV SMART SET (Cement) ×2 IMPLANT
CUFF TOURN SGL QUICK 34 (TOURNIQUET CUFF) ×2
CUFF TRNQT CYL 34X4X40X1 (TOURNIQUET CUFF) ×1 IMPLANT
DRAPE EXTREMITY T 121X128X90 (DRAPE) ×2 IMPLANT
DRAPE POUCH INSTRU U-SHP 10X18 (DRAPES) ×2 IMPLANT
DRAPE U-SHAPE 47X51 STRL (DRAPES) ×2 IMPLANT
DRSG AQUACEL AG ADV 3.5X10 (GAUZE/BANDAGES/DRESSINGS) ×2 IMPLANT
DRSG EMULSION OIL 3X3 NADH (GAUZE/BANDAGES/DRESSINGS) IMPLANT
DRSG PAD ABDOMINAL 8X10 ST (GAUZE/BANDAGES/DRESSINGS) IMPLANT
DRSG TEGADERM 4X4.75 (GAUZE/BANDAGES/DRESSINGS) IMPLANT
DURAPREP 26ML APPLICATOR (WOUND CARE) ×4 IMPLANT
ELECT REM PT RETURN 9FT ADLT (ELECTROSURGICAL) ×2
ELECTRODE REM PT RTRN 9FT ADLT (ELECTROSURGICAL) ×1 IMPLANT
EVACUATOR 1/8 PVC DRAIN (DRAIN) IMPLANT
FACESHIELD WRAPAROUND (MASK) ×8 IMPLANT
GAUZE SPONGE 2X2 8PLY STRL LF (GAUZE/BANDAGES/DRESSINGS) IMPLANT
GLOVE BIOGEL PI IND STRL 7.0 (GLOVE) ×2 IMPLANT
GLOVE BIOGEL PI IND STRL 7.5 (GLOVE) ×2 IMPLANT
GLOVE BIOGEL PI IND STRL 8.5 (GLOVE) ×1 IMPLANT
GLOVE BIOGEL PI INDICATOR 7.0 (GLOVE) ×2
GLOVE BIOGEL PI INDICATOR 7.5 (GLOVE) ×2
GLOVE BIOGEL PI INDICATOR 8.5 (GLOVE) ×1
GLOVE ECLIPSE 8.0 STRL XLNG CF (GLOVE) ×2 IMPLANT
GLOVE ORTHO TXT STRL SZ7.5 (GLOVE) ×4 IMPLANT
GLOVE SURG ORTHO 8.0 STRL STRW (GLOVE) IMPLANT
GLOVE SURG SS PI 7.0 STRL IVOR (GLOVE) ×2 IMPLANT
GLOVE SURG SS PI 7.5 STRL IVOR (GLOVE) ×2 IMPLANT
GOWN SPEC L3 XXLG W/TWL (GOWN DISPOSABLE) ×2 IMPLANT
GOWN SRG XL XLNG 56XLVL 4 (GOWN DISPOSABLE) ×1 IMPLANT
GOWN STRL NON-REIN XL XLG LVL4 (GOWN DISPOSABLE) ×2
GOWN STRL REUS W/TWL LRG LVL3 (GOWN DISPOSABLE) ×4 IMPLANT
KIT BASIN OR (CUSTOM PROCEDURE TRAY) ×2 IMPLANT
LEGGING LITHOTOMY PAIR STRL (DRAPES) ×2 IMPLANT
LIQUID BAND (GAUZE/BANDAGES/DRESSINGS) ×2 IMPLANT
MANIFOLD NEPTUNE II (INSTRUMENTS) ×4 IMPLANT
NDL SAFETY ECLIPSE 18X1.5 (NEEDLE) ×1 IMPLANT
NEEDLE HYPO 18GX1.5 SHARP (NEEDLE) ×2
PACK ARTHROSCOPY WL (CUSTOM PROCEDURE TRAY) IMPLANT
PACK TOTAL JOINT (CUSTOM PROCEDURE TRAY) ×2 IMPLANT
PADDING CAST COTTON 6X4 STRL (CAST SUPPLIES) IMPLANT
POSITIONER SURGICAL ARM (MISCELLANEOUS) IMPLANT
SET ARTHROSCOPY TUBING (MISCELLANEOUS) ×2
SET ARTHROSCOPY TUBING LN (MISCELLANEOUS) ×1 IMPLANT
SPONGE GAUZE 2X2 STER 10/PKG (GAUZE/BANDAGES/DRESSINGS)
SUCTION FRAZIER TIP 10 FR DISP (SUCTIONS) ×2 IMPLANT
SUT ETHILON 4 0 PS 2 18 (SUTURE) ×2 IMPLANT
SUT MNCRL AB 4-0 PS2 18 (SUTURE) ×2 IMPLANT
SUT VIC AB 1 CT1 36 (SUTURE) ×2 IMPLANT
SUT VIC AB 2-0 CT1 27 (SUTURE) ×4
SUT VIC AB 2-0 CT1 TAPERPNT 27 (SUTURE) ×2 IMPLANT
SUT VLOC 180 0 24IN GS25 (SUTURE) ×2 IMPLANT
SYR 50ML LL SCALE MARK (SYRINGE) ×2 IMPLANT
TOWEL OR 17X26 10 PK STRL BLUE (TOWEL DISPOSABLE) ×4 IMPLANT
TOWEL OR NON WOVEN STRL DISP B (DISPOSABLE) ×2 IMPLANT
TRAY FOLEY CATH 14FRSI W/METER (CATHETERS) ×2 IMPLANT
WRAP KNEE MAXI GEL POST OP (GAUZE/BANDAGES/DRESSINGS) ×2 IMPLANT

## 2014-10-18 NOTE — Transfer of Care (Signed)
Immediate Anesthesia Transfer of Care Note  Patient: Tammy Herman  Procedure(s) Performed: Procedure(s): RIGHT KNEE ARTHROSCOPY WITH LATERAL MENISECTOMY (Right) RIGHT MEDIAL COMPARTMENTAL UNI-KNEE ARTHROPLASTY (Right)  Patient Location: PACU  Anesthesia Type:Regional  Level of Consciousness: awake, alert  and oriented  Airway & Oxygen Therapy: Patient Spontanous Breathing and Patient connected to face mask oxygen  Post-op Assessment: Report given to PACU RN and Post -op Vital signs reviewed and stable  Post vital signs: Reviewed and stable  Complications: No apparent anesthesia complications

## 2014-10-18 NOTE — Interval H&P Note (Signed)
History and Physical Interval Note:  10/18/2014 9:23 AM  Tammy Herman  has presented today for surgery, with the diagnosis of RIGHT KNEE LATERAL MENISCUS TEAR/MEDIAL COMPARTMENTAL OA  The various methods of treatment have been discussed with the patient and family. After consideration of risks, benefits and other options for treatment, the patient has consented to  Procedure(s): RIGHT KNEE ARTHROSCOPY WITH LATERAL MENISECTOMY (Right) RIGHT MEDIAL COMPARTMENTAL UNI-KNEE ARTHROPLASTY (Right) as a surgical intervention .  The patient's history has been reviewed, patient examined, no change in status, stable for surgery.  I have reviewed the patient's chart and labs.  Questions were answered to the patient's satisfaction.     Mauri Pole

## 2014-10-18 NOTE — Op Note (Signed)
NAME: Tammy Herman    MEDICAL RECORD NO.: [id number]   FACILITY: [facility] OF BIRTH: [birth year]  PHYSICIAN: Pietro Cassis. Alvan Dame, M.D.    DATE OF PROCEDURE: 10/18/2014    OPERATIVE REPORT   PREOPERATIVE DIAGNOSIS: 1.  Right knee medial compartment osteoarthritis. 2.  Right knee lateral meniscal tear, anterior horn   POSTOPERATIVE DIAGNOSIS:   1.  Right knee medial compartment osteoarthritis. 2.  Right knee lateral meniscal tear, anterior horn  PROCEDURE:  1.  Right partial knee replacement utilizing Biomet Oxford knee  component, size small femur, a right medial size B tibial tray with a size 4 mm insert.   2.  Right knee arthroscopy - dictated under separate operative note  SURGEON: Pietro Cassis. Alvan Dame, M.D.   ASSISTANT: Danae Orleans, PAC.  Please note that Mr. Guinevere Scarlet was present for the entirety of the case,  utilized for preoperative positioning, perioperative retractor  management, general facilitation of the case and primary wound closure.   ANESTHESIA: spinal.   SPECIMENS: None.   COMPLICATIONS: None.  DRAINS: None   TOURNIQUET TIME: 30 minutes at 250 mmHg.   INDICATIONS FOR PROCEDURE: The patient is a 49 y.o. patient of mine who presented for evaluation of righ knee pain.  They presented with primary complaints of pain on the medial side of their knee. Radiographs revealed advanced medial compartment arthritis with specifically an antero-medial wear pattern.  There was bone on bone changes noted with subchondral sclerosis and osteophytes present. The patient has had progressive problems failing to respond to conservative measures of medications, injections and activity modification. Risks of infection, DVT, component failure, need for future revision surgery were all discussed and reviewed.  Consent was obtained for benefit of pain relief.   PROCEDURE IN DETAIL: The patient was brought to the operative theater.  Once adequate anesthesia, preoperative antibiotics, 2gm of  Ancef, 1gm of Tranexamic Acid, 10mg  of Decadron administered, the patient was positioned in supine position with a right thigh tourniquet  placed. The right lower extremity was prepped and draped in sterile  fashion with the leg on the Oxford leg holder.  The leg was allowed to flex to 120 degrees. A time-out  was performed identifying the patient, planned procedure, and extremity.  The leg was exsanguinated, tourniquet elevated to 250 mmHg. A midline  incision was made from the proximal pole of the patella to the tibial tubercle. A  soft tissue plane was created and partial median arthrotomy was then  made to allow for subluxation of the patella. Following initial synovectomy and  debridement, the osteophytes were removed off the medial aspect of the  knee.   Attention was first directed to the tibia. The tibial  extramedullary guide was positioned over the anterior crest of the tibia  and pinned into position, and using a measured resection guide from the  [place] system, a 4 mm resection was made off the proximal tibia. First  the reciprocating saw along the medial aspect of the tibial spines, then the oscillating saw.    At this point, I sized this cut surface seem to be best fit for a size B tibial tray.  With the retractors out of the wound and the knee held at 90 degrees the 4 feeler gauge had appropriate tension on the medial ligament.   At this point, the femoral canal was opened with a drill and the  intramedullary rod passed. Then using the guide for a small posterior resection off  the posterior aspect  of the femur was positioned over the mid portion of the medial femoral condyle.  The orientation was set using the guide that mates the femoral guide to the intramedullary rod.  The 2 drill holes were made into the distal femur.  The posterior guide was then impacted into place and the posterior  femoral cut made.  At this point, I milled the distal femur with a size 4 spigot in  place. At this point, we did a trial reduction of the small femur, size B tibial tray and the 4 feeler insert. At 90 degrees of  flexion and at 20 degrees of flexion the knee had symmetric tension on  the ligaments.   Given these findings, the trial femoral component was removed. Final preparation of tibia was carried out by pinning it in position. Then  using a reciprocating saw I removed bone for the keel. Further bone was  removed with an osteotome.  Trial reduction was now carried out with the small femur, the right medial keeled size B tibia, and the 4 lollipop insert. The balance of the  ligaments appeared to be symmetric at 20 degrees and 90 degrees. Given  all these findings, the trial components were removed.   Cement was mixed. The final components were opened. The knee was irrigated with  normal saline solution. Then final debridements of the  soft tissue was carried out, I also drilled the sclerotic bone with a drill. I injected the synovial capsular junction with Marcaine and toradol. The final components were cemented with a single batch of cement in a  two-stage technique with the tibial component cemented first. The knee  was then brought  to 45 degrees of flexion with a 4 feeler gauge, held with pressure for a minute and half.  After this the femoral component was cemented in place.  The knee was again held at 45 degrees of flexion while the cement fully cured.  Excess cement was removed throughout the knee. Tourniquet was let down  after 30 minutes. After the cement had fully cured and excessive cement  was removed throughout the knee there was no visualized cement present.   The final right medial size 4 insert to match the small femur was chosen and snapped into position. We re-irrigated  the knee. The extensor mechanism  was then reapproximated using a #1 Vicryl and #0 V-lock sutures with the knee in flexion. The  remaining wound was closed with 2-0 Vicryl and a running  4-0 Monocryl.  The knee was cleaned, dried, and dressed sterilely using Dermabond and  Aquacel dressing. The patient  was brought to the recovery room, Ace wrap in place, tolerating the  procedure well. She will be in the hospital for overnight observation.  We will initiate physical therapy and progress to ambulate.   Note lateral portal site closed as well in similar fashion and incorporated into the dressing.    Pietro Cassis Alvan Dame, M.D.

## 2014-10-18 NOTE — Anesthesia Preprocedure Evaluation (Addendum)
Anesthesia Evaluation  Patient identified by MRN, date of birth, ID band Patient awake    Reviewed: Allergy & Precautions, NPO status , Patient's Chart, lab work & pertinent test results  Airway Mallampati: II  TM Distance: >3 FB Neck ROM: Full    Dental no notable dental hx.    Pulmonary neg pulmonary ROS, former smoker,  breath sounds clear to auscultation  Pulmonary exam normal       Cardiovascular negative cardio ROS  Rhythm:Regular Rate:Normal     Neuro/Psych negative neurological ROS  negative psych ROS   GI/Hepatic negative GI ROS, Neg liver ROS,   Endo/Other  negative endocrine ROS  Renal/GU negative Renal ROS  negative genitourinary   Musculoskeletal negative musculoskeletal ROS (+)   Abdominal   Peds negative pediatric ROS (+)  Hematology negative hematology ROS (+)   Anesthesia Other Findings   Reproductive/Obstetrics negative OB ROS                            Anesthesia Physical Anesthesia Plan  ASA: II  Anesthesia Plan: Spinal   Post-op Pain Management:    Induction:   Airway Management Planned: Simple Face Mask  Additional Equipment:   Intra-op Plan:   Post-operative Plan:   Informed Consent: I have reviewed the patients History and Physical, chart, labs and discussed the procedure including the risks, benefits and alternatives for the proposed anesthesia with the patient or authorized representative who has indicated his/her understanding and acceptance.   Dental advisory given  Plan Discussed with: CRNA  Anesthesia Plan Comments:         Anesthesia Quick Evaluation

## 2014-10-18 NOTE — Anesthesia Procedure Notes (Signed)
Spinal Patient location during procedure: OR End time: 10/18/2014 9:45 AM Staffing Resident/CRNA: Noralyn Pick Performed by: anesthesiologist and resident/CRNA  Preanesthetic Checklist Completed: patient identified, site marked, surgical consent, pre-op evaluation, timeout performed, IV checked, risks and benefits discussed and monitors and equipment checked Spinal Block Patient position: sitting Prep: Betadine Patient monitoring: heart rate, continuous pulse ox and blood pressure Approach: midline Location: L3-4 Injection technique: single-shot Needle Needle type: Sprotte and Pencil-Tip  Needle gauge: 24 G Needle length: 9 cm Assessment Sensory level: T6 Additional Notes Expiration date of kit checked and confirmed. Patient tolerated procedure well, without complications.

## 2014-10-18 NOTE — Anesthesia Postprocedure Evaluation (Signed)
  Anesthesia Post-op Note  Patient: Tammy Herman  Procedure(s) Performed: Procedure(s) (LRB): RIGHT KNEE ARTHROSCOPY WITH LATERAL MENISECTOMY (Right) RIGHT MEDIAL COMPARTMENTAL UNI-KNEE ARTHROPLASTY (Right)  Patient Location: PACU  Anesthesia Type: Spinal  Level of Consciousness: awake and alert   Airway and Oxygen Therapy: Patient Spontanous Breathing  Post-op Pain: mild  Post-op Assessment: Post-op Vital signs reviewed, Patient's Cardiovascular Status Stable, Respiratory Function Stable, Patent Airway and No signs of Nausea or vomiting  Last Vitals:  Filed Vitals:   10/18/14 1600  BP: 121/83  Pulse: 82  Temp: 36.7 C  Resp: 12    Post-op Vital Signs: stable   Complications: No apparent anesthesia complications

## 2014-10-18 NOTE — Progress Notes (Addendum)
PT Cancellation Note  Patient Details Name: Tammy Herman MRN: [id number] DOB: 22-Mar-1966   Cancelled Treatment:    Reason Eval/Treat Not Completed: Other (comment) (legs are not awake enough, c/o  HA after rolling to R side a little bit.  RN aware   Claretha Cooper 10/18/2014, 4:53 PM

## 2014-10-18 NOTE — Brief Op Note (Signed)
10/18/2014  11:40 AM  PATIENT:  Tammy Herman  49 y.o. female  PRE-OPERATIVE DIAGNOSIS:  RIGHT KNEE LATERAL MENISCUS TEAR/MEDIAL COMPARTMENTAL OA  POST-OPERATIVE DIAGNOSIS:  RIGHT KNEE LATERAL MENISCUS TEAR/MEDIAL COMPARTMENTAL OA  PROCEDURE:  Procedure(s): RIGHT KNEE ARTHROSCOPY WITH LATERAL MENISECTOMY (Right) RIGHT MEDIAL COMPARTMENTAL UNI-KNEE ARTHROPLASTY (Right)  SURGEON:  Surgeon(s) and Role:    * Mauri Pole, MD - Primary  PHYSICIAN ASSISTANT: Danae Orleans, PA-C  ANESTHESIA:   spinal  EBL:  Total I/O In: 2000 [I.V.:2000] Out: 100 [Urine:50; Blood:50]  BLOOD ADMINISTERED:none  DRAINS: none   LOCAL MEDICATIONS USED:  MARCAINE for partial knee portion  SPECIMEN:  No Specimen  DISPOSITION OF SPECIMEN:  N/A  COUNTS:  YES  TOURNIQUET:   Total Tourniquet Time Documented: Thigh (Right) - 31 minutes Total: Thigh (Right) - 31 minutes   DICTATION: .Other Dictation: Dictation Number [id number]  PLAN OF CARE: Admit for overnight observation  PATIENT DISPOSITION:  PACU - hemodynamically stable.   Delay start of Pharmacological VTE agent (>24hrs) due to surgical blood loss or risk of bleeding: no

## 2014-10-19 ENCOUNTER — Encounter (HOSPITAL_COMMUNITY): Payer: Self-pay | Admitting: Orthopedic Surgery

## 2014-10-19 DIAGNOSIS — M1711 Unilateral primary osteoarthritis, right knee: Secondary | ICD-10-CM | POA: Diagnosis not present

## 2014-10-19 LAB — CBC
HCT: 37.3 % (ref 36.0–46.0)
Hemoglobin: 11.9 g/dL — ABNORMAL LOW (ref 12.0–15.0)
MCH: 29.3 pg (ref 26.0–34.0)
MCHC: 31.9 g/dL (ref 30.0–36.0)
MCV: 91.9 fL (ref 78.0–100.0)
Platelets: 308 10*3/uL (ref 150–400)
RBC: 4.06 MIL/uL (ref 3.87–5.11)
RDW: 13.6 % (ref 11.5–15.5)
WBC: 11.2 10*3/uL — ABNORMAL HIGH (ref 4.0–10.5)

## 2014-10-19 LAB — BASIC METABOLIC PANEL
Anion gap: 7 (ref 5–15)
BUN: 11 mg/dL (ref 6–23)
CO2: 27 mmol/L (ref 19–32)
Calcium: 8.2 mg/dL — ABNORMAL LOW (ref 8.4–10.5)
Chloride: 108 mmol/L (ref 96–112)
Creatinine, Ser: 0.83 mg/dL (ref 0.50–1.10)
GFR calc Af Amer: >90 mL/min (ref >90)
GFR calc non Af Amer: 81 mL/min — ABNORMAL LOW (ref >90)
Glucose, Bld: 96 mg/dL (ref 70–99)
Potassium: 3.9 mmol/L (ref 3.5–5.1)
Sodium: 142 mmol/L (ref 135–145)

## 2014-10-19 MED ORDER — DOCUSATE SODIUM 100 MG PO CAPS: 100.0000 mg | ORAL_CAPSULE | Freq: Two times a day (BID) | ORAL | Status: DC

## 2014-10-19 MED ORDER — POLYETHYLENE GLYCOL 3350 17 G PO PACK: 17.0000 g | PACK | Freq: Two times a day (BID) | ORAL | Status: DC

## 2014-10-19 MED ORDER — LORATADINE 10 MG PO TABS
10.0000 mg | ORAL_TABLET | Freq: Every day | ORAL | Status: DC
  Administered 2014-10-19: 10 mg via ORAL
  Filled 2014-10-19: qty 1

## 2014-10-19 MED ORDER — HYDROCODONE-ACETAMINOPHEN 7.5-325 MG PO TABS: 1.0000 | ORAL_TABLET | ORAL | Status: DC | PRN

## 2014-10-19 MED ORDER — ASPIRIN 325 MG PO TBEC: 325.0000 mg | DELAYED_RELEASE_TABLET | Freq: Every day | ORAL | Status: AC

## 2014-10-19 MED ORDER — METHOCARBAMOL 500 MG PO TABS: 500.0000 mg | ORAL_TABLET | Freq: Four times a day (QID) | ORAL | Status: DC | PRN

## 2014-10-19 MED ORDER — FERROUS SULFATE 325 (65 FE) MG PO TABS: 325.0000 mg | ORAL_TABLET | Freq: Three times a day (TID) | ORAL | Status: DC

## 2014-10-19 NOTE — Evaluation (Signed)
Physical Therapy Evaluation Patient Details Name: Tammy Herman MRN: [id number] DOB: 1966-06-08 Today's Date: 10/19/2014   History of Present Illness  Pt is a 49 year old female s/p R medial compartmental uniknee arthroplasty and lateral menisectomy.  Clinical Impression  Patient is s/p above surgery resulting in functional limitations due to the deficits listed below (see PT Problem List).  Patient will benefit from skilled PT to increase their independence and safety with mobility to allow discharge to the venue listed below.  Pt mobilizing well POD #1 and plans to d/c home with family later today.  Provided pt with HEP handout.    Follow Up Recommendations Home health PT    Equipment Recommendations  Rolling walker with 5" wheels    Recommendations for Other Services       Precautions / Restrictions Precautions Precautions: Knee      Mobility  Bed Mobility Overal bed mobility: Needs Assistance Bed Mobility: Sit to Supine       Sit to supine: Supervision   General bed mobility comments: verbal cues for self assist of R LE  Transfers Overall transfer level: Needs assistance Equipment used: Rolling walker (2 wheeled) Transfers: Sit to/from Stand Sit to Stand: Min guard         General transfer comment: verbal cues for UE and LE positioning  Ambulation/Gait Ambulation/Gait assistance: Min guard Ambulation Distance (Feet): 80 Feet Assistive device: Rolling walker (2 wheeled) Gait Pattern/deviations: Step-to pattern;Antalgic     General Gait Details: verbal cues for sequence, RW distance, step length, posture  Stairs            Wheelchair Mobility    Modified Rankin (Stroke Patients Only)       Balance                                             Pertinent Vitals/Pain Pain Assessment: 0-10 Pain Score: 4  Pain Location: R knee Pain Descriptors / Indicators: Aching;Sore Pain Intervention(s): Limited activity within patient's  tolerance;Monitored during session;Repositioned;Ice applied;Premedicated before session    Home Living Family/patient expects to be discharged to:: Private residence Living Arrangements: Spouse/significant other Available Help at Discharge: Family Type of Home: House Home Access: Stairs to enter   Technical brewer of Steps: 1 small step Home Layout: One level Home Equipment: Bedside commode;Crutches      Prior Function Level of Independence: Independent               Hand Dominance        Extremity/Trunk Assessment               Lower Extremity Assessment: RLE deficits/detail RLE Deficits / Details: good quad contraction, able to perform SLR, AAROM knee flexion approx 95* with heel slides       Communication   Communication: No difficulties  Cognition Arousal/Alertness: Awake/alert Behavior During Therapy: WFL for tasks assessed/performed Overall Cognitive Status: Within Functional Limits for tasks assessed                      General Comments      Exercises Total Joint Exercises Ankle Circles/Pumps: AROM;Both;15 reps Quad Sets: AROM;Both;15 reps Towel Squeeze: AROM;Both;15 reps Short Arc Quad: AROM;Right;15 reps Heel Slides: AAROM;Right;15 reps Hip ABduction/ADduction: AROM;Right;15 reps Straight Leg Raises: AROM;Right;10 reps      Assessment/Plan    PT Assessment Patient needs continued  PT services  PT Diagnosis Difficulty walking;Acute pain   PT Problem List Decreased strength;Decreased range of motion;Decreased mobility;Decreased knowledge of use of DME;Pain  PT Treatment Interventions Stair training;Gait training;DME instruction;Patient/family education;Functional mobility training;Therapeutic activities;Therapeutic exercise   PT Goals (Current goals can be found in the Care Plan section) Acute Rehab PT Goals PT Goal Formulation: With patient Time For Goal Achievement: 10/21/14 Potential to Achieve Goals: Good     Frequency 7X/week   Barriers to discharge        Co-evaluation               End of Session   Activity Tolerance: Patient tolerated treatment well Patient left: in bed;with call bell/phone within reach;with family/visitor present      Functional Assessment Tool Used: clinical judgement Functional Limitation: Mobility: Walking and moving around Mobility: Walking and Moving Around Current Status (A8341): At least 1 percent but less than 20 percent impaired, limited or restricted Mobility: Walking and Moving Around Goal Status [phone]): 0 percent impaired, limited or restricted    Time: 1005-1031 PT Time Calculation (min) (ACUTE ONLY): 26 min   Charges:   PT Evaluation $Initial PT Evaluation Tier I: 1 Procedure PT Treatments $Gait Training: 8-22 mins $Therapeutic Exercise: 8-22 mins   PT G Codes:   PT G-Codes **NOT FOR INPATIENT CLASS** Functional Assessment Tool Used: clinical judgement Functional Limitation: Mobility: Walking and moving around Mobility: Walking and Moving Around Current Status (L7989): At least 1 percent but less than 20 percent impaired, limited or restricted Mobility: Walking and Moving Around Goal Status [phone]): 0 percent impaired, limited or restricted    Meridian Scherger,Tammy Herman 10/19/2014, 1:07 PM Carmelia Bake, PT, DPT 10/19/2014 Pager: [phone]

## 2014-10-19 NOTE — Progress Notes (Signed)
   10/19/14 1500  PT Visit Information  Last PT Received On 10/19/14  Assistance Needed +1  History of Present Illness Pt is a 49 year old female s/p R medial compartmental uniknee arthroplasty and lateral menisectomy.  PT Time Calculation  PT Start Time (ACUTE ONLY) 1330  PT Stop Time (ACUTE ONLY) 1342  PT Time Calculation (min) (ACUTE ONLY) 12 min  Subjective Data  Subjective Pt ambulated in hallway again.  Also reviewed HEP handout with patient and answered questions.  Pt at supervision level currently and to have family assist available at home.  Pt feels ready for d/c home today.   Precautions  Precautions Knee  Pain Assessment  Pain Assessment 0-10  Pain Score 2  Pain Location R knee  Pain Descriptors / Indicators Aching;Sore  Pain Intervention(s) Limited activity within patient's tolerance;Monitored during session;Repositioned  Cognition  Arousal/Alertness Awake/alert  Behavior During Therapy WFL for tasks assessed/performed  Overall Cognitive Status Within Functional Limits for tasks assessed  Bed Mobility  Overal bed mobility Needs Assistance  Bed Mobility Sit to Supine;Supine to Sit  Supine to sit Supervision  Sit to supine Supervision  General bed mobility comments verbal cues for self assist of R LE  Transfers  Overall transfer level Needs assistance  Equipment used Rolling walker (2 wheeled)  Transfers Sit to/from Stand  Sit to Stand Supervision  General transfer comment verbal cues for UE and LE positioning  Ambulation/Gait  Ambulation/Gait assistance Supervision  Ambulation Distance (Feet) 160 Feet  Assistive device Rolling walker (2 wheeled)  Gait Pattern/deviations Step-to pattern;Antalgic;Step-through pattern;Decreased stride length  General Gait Details verbal cues for sequence, RW distance, step length, posture; able to progress to short steps step through pattern  Exercises  Exercises (verbally reviewed with HEP handout)  PT - End of Session  Activity  Tolerance Patient tolerated treatment well  Patient left in bed;with call bell/phone within reach;with family/visitor present  PT - Assessment/Plan  PT Plan Current plan remains appropriate  PT Goal Progression  Progress towards PT goals Progressing toward goals  PT G-Codes **NOT FOR INPATIENT CLASS**  Functional Assessment Tool Used clinical judgement  Functional Limitation Mobility: Walking and moving around  Mobility: Walking and Moving Around Current Status (M4268) CI  Mobility: Walking and Moving Around Goal Status (T4196) CI  Mobility: Walking and Moving Around Discharge Status (Q2297) CI  PT General Charges  $$ ACUTE PT VISIT 1 Procedure  PT Treatments  $Gait Training 8-22 mins   Carmelia Bake, PT, DPT 10/19/2014 Pager: [phone]

## 2014-10-19 NOTE — Evaluation (Signed)
Occupational Therapy Evaluation Patient Details Name: Tammy Herman MRN: [id number] DOB: 1965/12/24 Today's Date: 10/19/2014    History of Present Illness Pt is a 49 y/o female s/p R TKA/Uni-knee on 10/18/14.   Clinical Impression   Pt is a 49 y/o female s/p R uni-knee whom presents as needing Min guard-supervision for functional mobility and transfers w/ RW as well as LB dressing assistance. Pt has LH reacher at home and will have 3:1 & RW per her report. She will have PRN A from spouse/family and reports no further OT needs at this time.    Follow Up Recommendations  Supervision - Intermittent    Equipment Recommendations  Other (comment) (Pt states that she will have 3:1, RW and already has LH reacher at home, discussed use of shower chair if needed)    Recommendations for Other Services       Precautions / Restrictions Precautions Precautions: Knee;Fall Restrictions Weight Bearing Restrictions: No      Mobility Bed Mobility Overal bed mobility: Needs Assistance Bed Mobility: Supine to Sit     Supine to sit: Modified independent (Device/Increase time)     General bed mobility comments: Pt performing supine to sit EOB w/ HOB flat and did not use rails, increased time only  Transfers Overall transfer level: Needs assistance Equipment used: Rolling walker (2 wheeled) Transfers: Sit to/from [facility] Sit to Stand: Supervision;Min guard Stand pivot transfers: Supervision       General transfer comment: Pt requires vc's for RW safety and sequencing as well as hand placement during transfers.    Balance Overall balance assessment: Needs assistance Sitting-balance support: No upper extremity supported;Feet supported Sitting balance-Leahy Scale: Good     Standing balance support: Bilateral upper extremity supported;During functional activity Standing balance-Leahy Scale: Good                              ADL Overall ADL's : Needs  assistance/impaired Eating/Feeding: Independent;Sitting   Grooming: Wash/dry hands;Wash/dry face;Sitting;Set up   Upper Body Bathing: Set up;Sitting   Lower Body Bathing: Sit to/from stand;Min guard   Upper Body Dressing : Sitting;Independent;Set up Upper Body Dressing Details (indicate cue type and reason): Pt donned gown as robe I'ly sitting EOB Lower Body Dressing: Min guard;Sit to/from stand   Toilet Transfer: Min Lobbyist Details (indicate cue type and reason): Simulated toilet transfer, pt amb from EOB around bed in room, to bathroom door and back to recliner w/ Supervision-min guard assist (cues for hand placement) Toileting- Clothing Manipulation and Hygiene: Min guard;Sit to/from Nurse, children's Details (indicate cue type and reason): Verbal discussion of tub transfer w/ pt and pt's husband/daughter, reviewed technique as well as sponge bathe until pt demonstrates increased knee flexion for stepping into tub. Pt/husband verbalized understanding of Min A for this transfer. Discussed use of shower chair as well. Functional mobility during ADLs: Supervision/safety;Rolling walker;Min guard General ADL Comments: Pt was educated in role of OT and LB dressing techniques. Pt states that she will have 3:1, RW, and has reacher at home already. Discussed use of LH reacher for LB ADL's and pt states that spouse/family will also be able to assist at d/c PRN. Pt reports no further needs from acute OT, will sign off at this time.     Vision  No change from baseline  Perception     Praxis      Pertinent Vitals/Pain Pain Assessment: No/denies pain     Hand Dominance Right   Extremity/Trunk Assessment Upper Extremity Assessment Upper Extremity Assessment: Overall WFL for tasks assessed   Lower Extremity Assessment Lower Extremity Assessment: Defer to PT evaluation       Communication Communication Communication: No  difficulties   Cognition Arousal/Alertness: Awake/alert Behavior During Therapy: WFL for tasks assessed/performed Overall Cognitive Status: Within Functional Limits for tasks assessed                     General Comments       Exercises       Shoulder Instructions      Home Living Family/patient expects to be discharged to:: Private residence Living Arrangements: Spouse/significant other Available Help at Discharge: Family Type of Home: House Home Access: Stairs to enter Technical brewer of Steps: 1 small step   Home Layout: One level     Bathroom Shower/Tub: Tub/shower unit Shower/tub characteristics: Architectural technologist: Standard     Home Equipment: Environmental consultant - 2 wheels;Bedside commode;Crutches          Prior Functioning/Environment Level of Independence: Independent             OT Diagnosis: Generalized weakness;Acute pain   OT Problem List: Pain;Decreased activity tolerance   OT Treatment/Interventions:      OT Goals(Current goals can be found in the care plan section) Acute Rehab OT Goals Patient Stated Goal: Go home OT Goal Formulation: All assessment and education complete, DC therapy  OT Frequency:     Barriers to D/C:            Co-evaluation              End of Session Equipment Utilized During Treatment: Gait belt;Rolling walker  Activity Tolerance: Patient tolerated treatment well Patient left: in chair;with call bell/phone within reach;with family/visitor present   Time: 1497-0263 OT Time Calculation (min): 22 min Charges:  OT General Charges $OT Visit: 1 Procedure OT Evaluation $Initial OT Evaluation Tier I: 1 Procedure (22 min) G-Codes: OT G-codes **NOT FOR INPATIENT CLASS** Functional Assessment Tool Used: Clinical Judgement Functional Limitation: Self care Self Care Current Status (Z8588): At least 1 percent but less than 20 percent impaired, limited or restricted Self Care Goal Status (F0277): At least  1 percent but less than 20 percent impaired, limited or restricted Self Care Discharge Status [phone]): At least 1 percent but less than 20 percent impaired, limited or restricted  Almyra Deforest, OTR/L 10/19/2014, 8:54 AM

## 2014-10-19 NOTE — Discharge Summary (Signed)
Physician Discharge Summary  Patient ID: Tammy Herman MRN: [id number] DOB/AGE: 1965/12/13 49 y.o.  Admit date: 10/18/2014 Discharge date:  10/19/2014  Procedures:  Procedure(s) (LRB): RIGHT KNEE ARTHROSCOPY WITH LATERAL MENISECTOMY (Right) RIGHT MEDIAL COMPARTMENTAL UNI-KNEE ARTHROPLASTY (Right)  Attending Physician:  Dr. Paralee Cancel   Admission Diagnoses:   Right knee LMT and medial compartment primary OA / pain  Discharge Diagnoses:  Principal Problem:   S/P right UKR Active Problems:   S/P lateral meniscectomy of right knee   Status post unilateral knee replacement  Past Medical History  Diagnosis Date  . Depression   . Indigestion   . Amenorrhea   . GERD (gastroesophageal reflux disease)   . Arthritis     HPI:   Tammy Herman, 49 y.o. female, has a history of pain and functional disability in the right knee due to arthritis and has failed non-surgical conservative treatments for greater than 12 weeks to include NSAID's and/or analgesics, corticosteriod injections and activity modification. Onset of symptoms was gradual, starting 1+ years ago with gradually worsening course since that time. The patient noted no past surgery on the right knee(s). Patient currently rates pain in the right knee(s) at 10 out of 10 with activity. Patient has night pain, worsening of pain with activity and weight bearing, pain that interferes with activities of daily living, pain with passive range of motion, crepitus and joint swelling. Patient has evidence of periarticular osteophytes and joint space narrowing of the medial compartment by imaging studies. MRI also reveals right knee lateral meniscal tear. There is no active infection. Risks, benefits and expectations were discussed with the patient. Risks including but not limited to the risk of anesthesia, blood clots, nerve damage, blood vessel damage, failure of the prosthesis, infection and up to and including death. Patient  understand the risks, benefits and expectations and wishes to proceed with surgery.   PCP: Tivis Ringer, MD   Discharged Condition: good  Hospital Course:  Patient underwent the above stated procedure on 10/18/2014. Patient tolerated the procedure well and brought to the recovery room in good condition and subsequently to the floor.  POD #1 BP: 101/62 ; Pulse: 87 ; Temp: 98.4 F (36.9 C) ; Resp: 16 Patient reports pain as mild. Not much sleep last night due to activity in room versus pain issue. Did not walk last night.  Ready to be discharged home. Dorsiflexion/plantar flexion intact, incision: dressing C/D/I, no cellulitis present and compartment soft.   LABS  Basename    HGB  11.9  HCT  37.3    Discharge Exam: General appearance: alert, cooperative and no distress Extremities: Homans sign is negative, no sign of DVT, no edema, redness or tenderness in the calves or thighs and no ulcers, gangrene or trophic changes  Disposition:  Home  with follow up in 2 weeks   Follow-up Information    Follow up with Mauri Pole, MD. Schedule an appointment as soon as possible for a visit in 2 weeks.   Specialty:  Orthopedic Surgery   Contact information:   [address] [place] [address] [phone]       Follow up with [facility].   Why:  home health physical therapy   Contact information:   [address] [place] [address] [phone]       Follow up with [facility].   Why:  3n1 (commode) and rolling walker   Contact information:   [address] [place] [address] [phone]       Discharge Instructions    Call MD / Call 911    Complete by:  As directed   If you experience chest pain or shortness of breath, CALL 911 and be transported to the hospital emergency room.  If you develope a fever above 101 F, pus (white drainage) or increased drainage or redness at the wound, or calf pain,  call your surgeon's office.     Change dressing    Complete by:  As directed   Maintain surgical dressing until follow up in the clinic. If the edges start to pull up, may reinforce with tape. If the dressing is no longer working, may remove and cover with gauze and tape, but must keep the area dry and clean.  Call with any questions or concerns.     Constipation Prevention    Complete by:  As directed   Drink plenty of fluids.  Prune juice may be helpful.  You may use a stool softener, such as Colace (over the counter) 100 mg twice a day.  Use MiraLax (over the counter) for constipation as needed.     Diet - low sodium heart healthy    Complete by:  As directed      Discharge instructions    Complete by:  As directed   Maintain surgical dressing until follow up in the clinic. If the edges start to pull up, may reinforce with tape. If the dressing is no longer working, may remove and cover with gauze and tape, but must keep the area dry and clean.  Follow up in 2 weeks at [facility]. Call with any questions or concerns.     Driving restrictions    Complete by:  As directed   No driving for 4 weeks     Increase activity slowly as tolerated    Complete by:  As directed      TED hose    Complete by:  As directed   Use stockings (TED hose) for 2 weeks on both leg(s).  You may remove them at night for sleeping.     Weight bearing as tolerated    Complete by:  As directed   Laterality:  right  Extremity:  Lower             Medication List    STOP taking these medications        amoxicillin-clavulanate 875-125 MG per tablet  Commonly known as:  AUGMENTIN     Aspirin-Salicylamide-Caffeine 284-132-44.0 MG Pack     HYDROcodone-acetaminophen 5-325 MG per tablet  Commonly known as:  NORCO/VICODIN  Replaced by:  HYDROcodone-acetaminophen 7.5-325 MG per tablet      TAKE these medications        aspirin 325 MG EC tablet  Take 1 tablet (325 mg total) by mouth daily with  breakfast.     CRESTOR 10 MG tablet  Generic drug:  rosuvastatin  Take 10 mg by mouth daily.     docusate sodium 100 MG capsule  Commonly known as:  COLACE  Take 1 capsule (100 mg total) by mouth 2 (two) times daily.     ferrous sulfate 325 (65 FE) MG tablet  Take 1 tablet (325 mg total) by mouth 3 (three) times daily after meals.     HYDROcodone-acetaminophen 7.5-325 MG per tablet  Commonly known as:  NORCO  Take 1-2 tablets by mouth every 4 (four) hours as needed for moderate pain.  methocarbamol 500 MG tablet  Commonly known as:  ROBAXIN  Take 1 tablet (500 mg total) by mouth every 6 (six) hours as needed for muscle spasms.     polyethylene glycol packet  Commonly known as:  MIRALAX / GLYCOLAX  Take 17 g by mouth 2 (two) times daily.     ranitidine 150 MG tablet  Commonly known as:  ZANTAC  Take 150 mg by mouth daily.     traZODone 100 MG tablet  Commonly known as:  DESYREL  Take 100 mg by mouth at bedtime.     venlafaxine 75 MG tablet  Commonly known as:  EFFEXOR  Take 75 mg by mouth every morning.         Signed: [place] Pugh. Reinhardt Licausi   PA-C  10/19/2014, 12:48 PM

## 2014-10-19 NOTE — Progress Notes (Signed)
UR completed 

## 2014-10-19 NOTE — Progress Notes (Signed)
Patient ID: Tammy Herman, female   DOB: May 29, 1966, 49 y.o.   MRN: [id number] Subjective: 1 Day Post-Op Procedure(s) (LRB): RIGHT KNEE ARTHROSCOPY WITH LATERAL MENISECTOMY (Right) RIGHT MEDIAL COMPARTMENTAL UNI-KNEE ARTHROPLASTY (Right)    Patient reports pain as mild.  Not much sleep last night due to activity in room versus pain issue.  Did not walk last night  Objective:   VITALS:   Filed Vitals:   10/19/14 0613  BP: 101/62  Pulse: 87  Temp: 98.4 F (36.9 C)  Resp: 16    Neurovascular intact Incision: dressing C/D/I  LABS  Recent Labs  10/19/14 0511  HGB 11.9*  HCT 37.3  WBC 11.2*  PLT 308     Recent Labs  10/19/14 0511  NA 142  K 3.9  BUN 11  CREATININE 0.83  GLUCOSE 96    No results for input(s): LABPT, INR in the last 72 hours.   Assessment/Plan: 1 Day Post-Op Procedure(s) (LRB): RIGHT KNEE ARTHROSCOPY WITH LATERAL MENISECTOMY (Right) RIGHT MEDIAL COMPARTMENTAL UNI-KNEE ARTHROPLASTY (Right)   Up with therapy Discharge home with home health today after therapy  RTC in 2 weeks to check wound and progress

## 2014-10-19 NOTE — Op Note (Signed)
NAMEOAKLYN, JAKUBEK               ACCOUNT NO.:  [id number]  MEDICAL RECORD NO.:  [id number]  LOCATION:  4315                         FACILITY:  [facility]  PHYSICIAN:  Pietro Cassis. Alvan Dame, M.D.  DATE OF BIRTH:  25-Dec-1965  DATE OF PROCEDURE:  10/18/2014 DATE OF DISCHARGE:                              OPERATIVE REPORT   PREOPERATIVE DIAGNOSES: 1. Right knee medial compartment advanced osteoarthritis with complete     loss of joint space. 2. Right knee lateral meniscal tear, anterior horn.  POSTOPERATIVE DIAGNOSES/FINDINGS: 1. Advanced right knee osteoarthritis medially. 2. Anterior horn degenerative tearing of the lateral meniscus. 3. 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  PROCEDURES:  This portion of the dictation is for the right knee arthroscopic surgery.  The operative note for the right partial knee arthroplasty is through templated operative note on the system. 1. Right knee diagnostic and operative arthroscopy with patellofemoral     chondroplasty, debridement of unstable flaps. 2. Anterior horn lateral partial meniscectomy.  SURGEON:  Pietro Cassis. Alvan Dame, M.D.  ASSISTANT:  Danae Orleans, PA who actually utilized in the arthroscopic port of the case in addition to the medial arthroplasty.  For the arthroscopic surgery, he was utilized for leg positioning due to positioning of the patient for partial knee arthroplasty.  He was there for initial set up in the operative leg management as well as wound closure.  ANESTHESIA:  Spinal for both cases.  COMPLICATIONS:  None.  DRAINS:  None.  TOURNIQUET:  Tourniquet was utilized for the partial knee arthroplasty as only knot for the arthroscopic surgery.  INDICATION FOR PROCEDURE:  Ms. Mcglynn is a 49 year old female who presented to the office for predominant right medial based knee symptoms.  She has occasional catching symptoms laterally.  After recognizing the failure to respond to conservative measures as well as the  both sides of symptoms, MRI was ordered which revealed concerns for anterior horn of lateral meniscal tear.  Given her age and predominant issues with an anteromedial wear pattern over the medial aspect of the joint, I felt that she was a good candidate for partial knee arthroplasty and did not want her to have to go through total-knee arthroplasty if the patellofemoral lateral compartments were relatively intact.  I thus discussed with her the possibility of arthroscopic surgery, managing the lateral meniscal tear as well as then progressing to the partial knee arthroplasty managing both of the procedures at one time.  Risks of potential progression or recurrent lateral meniscal tear, progressive lateral knee arthritis, patellofemoral arthritis of the knee, and conversion to total knee arthroplasty were discussed as well as standard risks of infection, DVT, component failure, need for future revision surgery in this 49 year old female.  Consent was obtained for benefit of above.  Please note that the partial knee arthroplasty was in the hospital system under a separate templated operative note.  PROCEDURE IN DETAIL:  The patient was brought to the operative theater. Once adequate anesthesia, preoperative antibiotics, Ancef as well as tranexamic acid, and Decadron administered.  She was positioned with the right thigh tourniquet placed.  The right leg was allowed to drape over the Oxford leg holder for positioning of the right  partial knee arthroplasty.  The right lower extremity was then prepped and draped in sterile fashion.  The time-out was performed identifying this initial planned procedure as well as a partial knee arthroplasty in the correct lower extremity.  Initially, based on landmarks, I created the inferolateral portal and then subsequently inferomedial portal in line with the longitudinal incision for the partial knee arthroplasty.  The inferomedial portal was utilized  as a sole working portal. Following the diagnostic evaluation of the knee indicating some grade 2- 3 chondromalacia of patellar apex with no associated trochlear, chondromalacia of the medial compartment was recognized as having advanced degenerative changes.  I then focused the attention in the lateral compartment.  In the lateral compartment after debriding a lot of synovium in this area, I was able to expose this anterior horn of the lateral meniscus and then using a 3.5 Cuda shaver and the straight biting basket, I performed the partial meniscectomy taking this back to a stable level within the central to peripheral third of the meniscus. The lateral compartment was otherwise intact.  Her ACL was noted to be intact.  Anteriorly, I used the 3.5 Cuda shaver to perform chondroplasty debriding some of the unstable chondral flaps on the border of this over the apex of the patella back to a stable level.  Again, no evidence of eburnated bone and the associated trochlea was intact without evidence of any chondromalacia.  Upon completion of these above-stated procedures, the instrumentation was removed from the knee.  We then proceeded with the partial knee arthroplasty.  The leg had already been prepped and draped accordingly as well as incision marked out for the partial knee.  Please see templated operative note for details of that procedure.  It was a Astronomer partial knee arthroplasty with a size small femur B, right medial tibial tray, and a size 4 insert.     Pietro Cassis Alvan Dame, M.D.     MDO/MEDQ  D:  10/18/2014  T:  10/19/2014  Job:  [id number]

## 2014-10-19 NOTE — Care Management Note (Signed)
    Page 1 of 2   10/19/2014     12:07:30 PM CARE MANAGEMENT NOTE 10/19/2014  Patient:  Tammy Herman, Tammy Herman   Account Number:  [id number]  Date Initiated:  10/19/2014  Documentation initiated by:  [facility]  Subjective/Objective Assessment:   adm: RIGHT KNEE ARTHROSCOPY WITH LATERAL MENISECTOMY (Right)  RIGHT MEDIAL COMPARTMENTAL UNI-KNEE ARTHROPLASTY (Right)     Action/Plan:   discharge planning   Anticipated DC Date:  10/19/2014   Anticipated DC Plan:  [place]  CM consult      [facility] Choice  HOME HEALTH   Choice offered to / List presented to:  C-1 Patient   DME arranged  3-N-1  Vassie Moselle      DME agency  [place] arranged  [facility]   Status of service:  Completed, signed off Medicare Important Message given?   (If response is "NO", the following Medicare IM given date fields will be blank) Date Medicare IM given:   Medicare IM given by:   Date Additional Medicare IM given:   Additional Medicare IM given by:    Discharge Disposition:  [place]  Per UR Regulation:    If discussed at Long Length of Stay Meetings, dates discussed:    Comments:  10/19/14 09:00 Cm met with pt in room to offer choice of home health agency. Pt chooses [facility]. Address and contact information verified by pt.  CM called [facility] DME rep, Lecretia to please deliver 3n1 and rolling walker to room prior to discharge.  Referral given to Shaune Leeks. No other CM needs were communicated. Mariane Masters, BSN, CM [phone].

## 2015-06-08 NOTE — H&P (Signed)
TOTAL KNEE ADMISSION H&P  Patient is being admitted for left total knee arthroplasty.  Subjective:  Chief Complaint:   Left knee primary OA / pain.  HPI: Tammy Herman, 49 y.o. female, has a history of pain and functional disability in the left knee due to arthritis and has failed non-surgical conservative treatments for greater than 12 weeks to include NSAID's and/or analgesics, corticosteriod injections, viscosupplementation injections and activity modification.  Onset of symptoms was gradual, starting 3+ years ago with gradually worsening course since that time. The patient noted prior procedures on the knee to include  arthroscopy and menisectomy on the left knee(s).  Patient currently rates pain in the left knee(s) at 8 out of 10 with activity. Patient has night pain, worsening of pain with activity and weight bearing, pain that interferes with activities of daily living, pain with passive range of motion, crepitus and joint swelling.  Patient has evidence of periarticular osteophytes and joint space narrowing by imaging studies.  There is no active infection.  Risks, benefits and expectations were discussed with the patient.  Risks including but not limited to the risk of anesthesia, blood clots, nerve damage, blood vessel damage, failure of the prosthesis, infection and up to and including death.  Patient understand the risks, benefits and expectations and wishes to proceed with surgery.   PCP: Tivis Ringer, MD  D/C Plans:      Home with HHPT  Post-op Meds:       No Rx given   Tranexamic Acid:      To be given - IV  Decadron:      Is to be given  FYI:     ASA post-op  Norco post-op    Patient Active Problem List   Diagnosis Date Noted  . S/P lateral meniscectomy of right knee 10/18/2014  . S/P right UKR 10/18/2014  . Status post unilateral knee replacement 10/18/2014  . Pure hypercholesterolemia 07/27/2013   Past Medical History  Diagnosis Date  . Depression   .  Indigestion   . Amenorrhea   . GERD (gastroesophageal reflux disease)   . Arthritis     Past Surgical History  Procedure Laterality Date  . Tubal ligation  2001  . Knee surgery Left 08/2012  . Knee arthroscopy with lateral menisectomy Right 10/18/2014    Procedure: RIGHT KNEE ARTHROSCOPY WITH LATERAL MENISECTOMY;  Surgeon: Mauri Pole, MD;  Location: [facility];  Service: Orthopedics;  Laterality: Right;  . Partial knee arthroplasty Right 10/18/2014    Procedure: RIGHT MEDIAL COMPARTMENTAL UNI-KNEE ARTHROPLASTY;  Surgeon: Mauri Pole, MD;  Location: [facility];  Service: Orthopedics;  Laterality: Right;    No prescriptions prior to admission   No Known Allergies   Social History  Substance Use Topics  . Smoking status: Former Smoker    Quit date: 07/27/2006  . Smokeless tobacco: Never Used  . Alcohol Use: 2.5 - 3.0 oz/week    5-6 Standard drinks or equivalent per week     Comment: occasional     Family History  Problem Relation Age of Onset  . Hypertension Mother   . Diabetes Father     Type 2  . Diabetes Sister     Type 2     Review of Systems  Constitutional: Negative.   HENT: Negative.   Eyes: Negative.   Respiratory: Negative.   Cardiovascular: Negative.   Gastrointestinal: Positive for heartburn.  Genitourinary: Negative.   Musculoskeletal: Positive for joint pain.  Skin: Negative.  Neurological: Negative.   Endo/Heme/Allergies: Negative.   Psychiatric/Behavioral: Positive for depression.    Objective:  Physical Exam  Constitutional: She is oriented to person, place, and time. She appears well-developed and well-nourished.  HENT:  Head: Normocephalic.  Eyes: Pupils are equal, round, and reactive to light.  Neck: Neck supple. No JVD present. No tracheal deviation present. No thyromegaly present.  Cardiovascular: Normal rate, regular rhythm, normal heart sounds and intact distal pulses.   Respiratory: Effort normal and breath sounds normal. No stridor. No  respiratory distress. She has no wheezes.  GI: Soft. There is no tenderness. There is no guarding.  Musculoskeletal:       Left knee: She exhibits decreased range of motion, swelling and bony tenderness. She exhibits no ecchymosis, no deformity, no laceration and no erythema. Tenderness found.  Lymphadenopathy:    She has no cervical adenopathy.  Neurological: She is alert and oriented to person, place, and time.  Skin: Skin is warm and dry.  Psychiatric: She has a normal mood and affect.     Labs:  Estimated body mass index is 32.46 kg/(m^2) as calculated from the following:   Height as of 10/18/14: 5\' 6"  (1.676 m).   Weight as of 10/13/14: 91.173 kg (201 lb).   Imaging Review Plain radiographs demonstrate severe degenerative joint disease of the left knee(s). The overall alignment is neutral. The bone quality appears to be good for age and reported activity level.  Assessment/Plan:  End stage arthritis, left knee   The patient history, physical examination, clinical judgment of the provider and imaging studies are consistent with end stage degenerative joint disease of the left knee(s) and total knee arthroplasty is deemed medically necessary. The treatment options including medical management, injection therapy arthroscopy and arthroplasty were discussed at length. The risks and benefits of total knee arthroplasty were presented and reviewed. The risks due to aseptic loosening, infection, stiffness, patella tracking problems, thromboembolic complications and other imponderables were discussed. The patient acknowledged the explanation, agreed to proceed with the plan and consent was signed. Patient is being admitted for inpatient treatment for surgery, pain control, PT, OT, prophylactic antibiotics, VTE prophylaxis, progressive ambulation and ADL's and discharge planning. The patient is planning to be discharged home with home health services.     [place] Pugh Siarah Deleo   PA-C  06/08/2015,  1:16 PM

## 2015-06-14 ENCOUNTER — Encounter (HOSPITAL_COMMUNITY)
Admission: RE | Admit: 2015-06-14 | Discharge: 2015-06-14 | Disposition: A | Discharge status: Home / Self Care | Payer: 59 | Source: Ambulatory Visit | Attending: Orthopedic Surgery | Admitting: Orthopedic Surgery

## 2015-06-14 ENCOUNTER — Encounter (HOSPITAL_COMMUNITY): Payer: Self-pay

## 2015-06-14 DIAGNOSIS — Z01818 Encounter for other preprocedural examination: Secondary | ICD-10-CM | POA: Insufficient documentation

## 2015-06-14 DIAGNOSIS — M179 Osteoarthritis of knee, unspecified: Secondary | ICD-10-CM | POA: Insufficient documentation

## 2015-06-14 HISTORY — DX: Hyperlipidemia, unspecified: E78.5

## 2015-06-14 LAB — BASIC METABOLIC PANEL
Anion gap: 10 (ref 5–15)
BUN: 16 mg/dL (ref 6–20)
CO2: 25 mmol/L (ref 22–32)
Calcium: 8.8 mg/dL — ABNORMAL LOW (ref 8.9–10.3)
Chloride: 106 mmol/L (ref 101–111)
Creatinine, Ser: 0.99 mg/dL (ref 0.44–1.00)
GFR calc Af Amer: >60 mL/min (ref >60)
GFR calc non Af Amer: >60 mL/min (ref >60)
Glucose, Bld: 90 mg/dL (ref 65–99)
Potassium: 4.2 mmol/L (ref 3.5–5.1)
Sodium: 141 mmol/L (ref 135–145)

## 2015-06-14 LAB — CBC
HCT: 42.4 % (ref 36.0–46.0)
Hemoglobin: 13.5 g/dL (ref 12.0–15.0)
MCH: 29.2 pg (ref 26.0–34.0)
MCHC: 31.8 g/dL (ref 30.0–36.0)
MCV: 91.8 fL (ref 78.0–100.0)
Platelets: 366 10*3/uL (ref 150–400)
RBC: 4.62 MIL/uL (ref 3.87–5.11)
RDW: 13.7 % (ref 11.5–15.5)
WBC: 6.8 10*3/uL (ref 4.0–10.5)

## 2015-06-14 LAB — PROTIME-INR
INR: 0.96 (ref 0.00–1.49)
Prothrombin Time: 13 seconds (ref 11.6–15.2)

## 2015-06-14 LAB — URINALYSIS, ROUTINE W REFLEX MICROSCOPIC
Bilirubin Urine: NEGATIVE
Glucose, UA: NEGATIVE mg/dL
Hgb urine dipstick: NEGATIVE
Ketones, ur: NEGATIVE mg/dL
Leukocytes, UA: NEGATIVE
Nitrite: NEGATIVE
Protein, ur: NEGATIVE mg/dL
Specific Gravity, Urine: 1.037 — ABNORMAL HIGH (ref 1.005–1.030)
Urobilinogen, UA: 0.2 mg/dL (ref 0.0–1.0)
pH: 5 (ref 5.0–8.0)

## 2015-06-14 LAB — SURGICAL PCR SCREEN
MRSA, PCR: NEGATIVE
Staphylococcus aureus: NEGATIVE

## 2015-06-14 LAB — APTT: aPTT: 32 seconds (ref 24–37)

## 2015-06-14 NOTE — Patient Instructions (Addendum)
YOUR PROCEDURE IS SCHEDULED ON :  06/20/15  REPORT TO [facility] MAIN ENTRANCE FOLLOW SIGNS TO EAST ELEVATOR - GO TO 3rd FLOOR CHECK IN AT 3 EAST NURSES STATION (SHORT STAY) AT:  7:15 AM  CALL THIS NUMBER IF YOU HAVE PROBLEMS THE MORNING OF SURGERY [phone]  REMEMBER:ONLY 1 PER PERSON MAY GO TO SHORT STAY WITH YOU TO GET READY THE MORNING OF YOUR SURGERY  DO NOT EAT FOOD OR DRINK LIQUIDS AFTER MIDNIGHT  TAKE THESE MEDICINES THE MORNING OF SURGERY: EFFEXOR / CRESTOR  YOU MAY NOT HAVE ANY METAL ON YOUR BODY INCLUDING HAIR PINS AND PIERCING'S. DO NOT WEAR JEWELRY, MAKEUP, LOTIONS, POWDERS OR PERFUMES. DO NOT WEAR NAIL POLISH. DO NOT SHAVE 48 HRS PRIOR TO SURGERY. MEN MAY SHAVE FACE AND NECK.  DO NOT [place]. [facility] IS NOT RESPONSIBLE FOR VALUABLES.  CONTACTS, DENTURES OR PARTIALS MAY NOT BE WORN TO SURGERY. LEAVE SUITCASE IN CAR. CAN BE BROUGHT TO ROOM AFTER SURGERY.  PATIENTS DISCHARGED THE DAY OF SURGERY WILL NOT BE ALLOWED TO DRIVE HOME.  PLEASE READ OVER THE FOLLOWING INSTRUCTION SHEETS _________________________________________________________________________________                                          [facility] - PREPARING FOR SURGERY  Before surgery, you can play an important role.  Because skin is not sterile, your skin needs to be as free of germs as possible.  You can reduce the number of germs on your skin by washing with CHG (chlorahexidine gluconate) soap before surgery.  CHG is an antiseptic cleaner which kills germs and bonds with the skin to continue killing germs even after washing. Please DO NOT use if you have an allergy to CHG or antibacterial soaps.  If your skin becomes reddened/irritated stop using the CHG and inform your nurse when you arrive at Short Stay. Do not shave (including legs and underarms) for at least 48 hours prior to the first CHG shower.  You may shave your face. Please follow these instructions  carefully:   1.  Shower with CHG Soap the night before surgery and the  morning of Surgery.   2.  If you choose to wash your hair, wash your hair first as usual with your  normal  Shampoo.   3.  After you shampoo, rinse your hair and body thoroughly to remove the  shampoo.                                         4.  Use CHG as you would any other liquid soap.  You can apply chg directly  to the skin and wash . Gently wash with scrungie or clean wascloth    5.  Apply the CHG Soap to your body ONLY FROM THE NECK DOWN.   Do not use on open                           Wound or open sores. Avoid contact with eyes, ears mouth and genitals (private parts).                        Genitals (private parts) with your normal soap.  6.  Wash thoroughly, paying special attention to the area where your surgery  will be performed.   7.  Thoroughly rinse your body with warm water from the neck down.   8.  DO NOT shower/wash with your normal soap after using and rinsing off  the CHG Soap .                9.  Pat yourself dry with a clean towel.             10.  Wear clean night clothes to bed after shower             11.  Place clean sheets on your bed the night of your first shower and do not  sleep with pets.  Day of Surgery : Do not apply any lotions/deodorants the morning of surgery.  Please wear clean clothes to the hospital/surgery center.  FAILURE TO FOLLOW THESE INSTRUCTIONS MAY RESULT IN THE CANCELLATION OF YOUR SURGERY    PATIENT SIGNATURE_________________________________  ______________________________________________________________________     Tammy Herman  An incentive spirometer is a tool that can help keep your lungs clear and active. This tool measures how well you are filling your lungs with each breath. Taking long deep breaths may help reverse or decrease the chance of developing breathing (pulmonary) problems (especially infection) following:  A long  period of time when you are unable to move or be active. BEFORE THE PROCEDURE   If the spirometer includes an indicator to show your best effort, your nurse or respiratory therapist will set it to a desired goal.  If possible, sit up straight or lean slightly forward. Try not to slouch.  Hold the incentive spirometer in an upright position. INSTRUCTIONS FOR USE   Sit on the edge of your bed if possible, or sit up as far as you can in bed or on a chair.  Hold the incentive spirometer in an upright position.  Breathe out normally.  Place the mouthpiece in your mouth and seal your lips tightly around it.  Breathe in slowly and as deeply as possible, raising the piston or the ball toward the top of the column.  Hold your breath for 3-5 seconds or for as long as possible. Allow the piston or ball to fall to the bottom of the column.  Remove the mouthpiece from your mouth and breathe out normally.  Rest for a few seconds and repeat Steps 1 through 7 at least 10 times every 1-2 hours when you are awake. Take your time and take a few normal breaths between deep breaths.  The spirometer may include an indicator to show your best effort. Use the indicator as a goal to work toward during each repetition.  After each set of 10 deep breaths, practice coughing to be sure your lungs are clear. If you have an incision (the cut made at the time of surgery), support your incision when coughing by placing a pillow or rolled up towels firmly against it. Once you are able to get out of bed, walk around indoors and cough well. You may stop using the incentive spirometer when instructed by your caregiver.  RISKS AND COMPLICATIONS  Take your time so you do not get dizzy or light-headed.  If you are in pain, you may need to take or ask for pain medication before doing incentive spirometry. It is harder to take a deep breath if you are having pain. AFTER USE  Rest and breathe slowly and easily.  It can  be helpful to keep track of a log of your progress. Your caregiver can provide you with a simple table to help with this. If you are using the spirometer at home, follow these instructions: [place] IF:   You are having difficultly using the spirometer.  You have trouble using the spirometer as often as instructed.  Your pain medication is not giving enough relief while using the spirometer.  You develop fever of 100.5 F (38.1 C) or higher. SEEK IMMEDIATE MEDICAL CARE IF:   You cough up bloody sputum that had not been present before.  You develop fever of 102 F (38.9 C) or greater.  You develop worsening pain at or near the incision site. MAKE SURE YOU:   Understand these instructions.  Will watch your condition.  Will get help right away if you are not doing well or get worse. Document Released: 01/21/2007 Document Revised: 12/03/2011 Document Reviewed: 03/24/2007 Exit[employer] Patient Information 2014 [employer], [place].   ________________________________________________________________________  WHAT IS A BLOOD TRANSFUSION? Blood Transfusion Information  A transfusion is the replacement of blood or some of its parts. Blood is made up of multiple cells which provide different functions.  Red blood cells carry oxygen and are used for blood loss replacement.  White blood cells fight against infection.  Platelets control bleeding.  Plasma helps clot blood.  Other blood products are available for specialized needs, such as hemophilia or other clotting disorders. BEFORE THE TRANSFUSION  Who gives blood for transfusions?   Healthy volunteers who are fully evaluated to make sure their blood is safe. This is blood bank blood. Transfusion therapy is the safest it has ever been in the practice of medicine. Before blood is taken from a donor, a complete history is taken to make sure that person has no history of diseases nor engages in risky social behavior (examples are  intravenous drug use or sexual activity with multiple partners). The donor's travel history is screened to minimize risk of transmitting infections, such as malaria. The donated blood is tested for signs of infectious diseases, such as HIV and hepatitis. The blood is then tested to be sure it is compatible with you in order to minimize the chance of a transfusion reaction. If you or a relative donates blood, this is often done in anticipation of surgery and is not appropriate for emergency situations. It takes many days to process the donated blood. RISKS AND COMPLICATIONS Although transfusion therapy is very safe and saves many lives, the main dangers of transfusion include:   Getting an infectious disease.  Developing a transfusion reaction. This is an allergic reaction to something in the blood you were given. Every precaution is taken to prevent this. The decision to have a blood transfusion has been considered carefully by your caregiver before blood is given. Blood is not given unless the benefits outweigh the risks. AFTER THE TRANSFUSION  Right after receiving a blood transfusion, you will usually feel much better and more energetic. This is especially true if your red blood cells have gotten low (anemic). The transfusion raises the level of the red blood cells which carry oxygen, and this usually causes an energy increase.  The nurse administering the transfusion will monitor you carefully for complications. HOME CARE INSTRUCTIONS  No special instructions are needed after a transfusion. You may find your energy is better. Speak with your caregiver about any limitations on activity for underlying diseases you may have. SEEK MEDICAL CARE IF:   Your  condition is not improving after your transfusion.  You develop redness or irritation at the intravenous (IV) site. SEEK IMMEDIATE MEDICAL CARE IF:  Any of the following symptoms occur over the next 12 hours:  Shaking chills.  You have a  temperature by mouth above 102 F (38.9 C), not controlled by medicine.  Chest, back, or muscle pain.  People around you feel you are not acting correctly or are confused.  Shortness of breath or difficulty breathing.  Dizziness and fainting.  You get a rash or develop hives.  You have a decrease in urine output.  Your urine turns a dark color or changes to pink, red, or brown. Any of the following symptoms occur over the next 10 days:  You have a temperature by mouth above 102 F (38.9 C), not controlled by medicine.  Shortness of breath.  Weakness after normal activity.  The white part of the eye turns yellow (jaundice).  You have a decrease in the amount of urine or are urinating less often.  Your urine turns a dark color or changes to pink, red, or brown. Document Released: 09/07/2000 Document Revised: 12/03/2011 Document Reviewed: 04/26/2008 [facility] Patient Information 2014 [place], [place].  _______________________________________________________________________

## 2015-06-20 ENCOUNTER — Encounter (HOSPITAL_COMMUNITY)
Admission: RE | Disposition: A | Discharge status: Home Health | Payer: Self-pay | Source: Ambulatory Visit | Attending: Orthopedic Surgery

## 2015-06-20 ENCOUNTER — Inpatient Hospital Stay (HOSPITAL_COMMUNITY): Payer: 59 | Admitting: Anesthesiology

## 2015-06-20 ENCOUNTER — Encounter (HOSPITAL_COMMUNITY): Payer: Self-pay | Admitting: *Deleted

## 2015-06-20 ENCOUNTER — Inpatient Hospital Stay (HOSPITAL_COMMUNITY)
Admission: RE | Admit: 2015-06-20 | Discharge: 2015-06-22 | DRG: 470 | Disposition: A | Discharge status: Home Health | Payer: 59 | Source: Ambulatory Visit | Attending: Orthopedic Surgery | Admitting: Orthopedic Surgery

## 2015-06-20 DIAGNOSIS — Z01812 Encounter for preprocedural laboratory examination: Secondary | ICD-10-CM

## 2015-06-20 DIAGNOSIS — K219 Gastro-esophageal reflux disease without esophagitis: Secondary | ICD-10-CM | POA: Diagnosis present

## 2015-06-20 DIAGNOSIS — M659 Synovitis and tenosynovitis, unspecified: Secondary | ICD-10-CM | POA: Diagnosis present

## 2015-06-20 DIAGNOSIS — M1712 Unilateral primary osteoarthritis, left knee: Principal | ICD-10-CM | POA: Diagnosis present

## 2015-06-20 DIAGNOSIS — M25562 Pain in left knee: Secondary | ICD-10-CM | POA: Diagnosis present

## 2015-06-20 DIAGNOSIS — Z96652 Presence of left artificial knee joint: Secondary | ICD-10-CM

## 2015-06-20 DIAGNOSIS — Z87891 Personal history of nicotine dependence: Secondary | ICD-10-CM

## 2015-06-20 HISTORY — PX: TOTAL KNEE ARTHROPLASTY: SHX125

## 2015-06-20 LAB — TYPE AND SCREEN
ABO/RH(D): O POS
Antibody Screen: NEGATIVE

## 2015-06-20 SURGERY — ARTHROPLASTY, KNEE, TOTAL
Anesthesia: Spinal | Site: Knee | Laterality: Left

## 2015-06-20 MED ORDER — ONDANSETRON HCL 4 MG PO TABS
4.0000 mg | ORAL_TABLET | Freq: Four times a day (QID) | ORAL | Status: DC | PRN
  Administered 2015-06-21: 4 mg via ORAL
  Filled 2015-06-20: qty 1

## 2015-06-20 MED ORDER — FERROUS SULFATE 325 (65 FE) MG PO TABS: 325.0000 mg | ORAL_TABLET | Freq: Three times a day (TID) | ORAL | Status: DC

## 2015-06-20 MED ORDER — PHENYLEPHRINE 40 MCG/ML (10ML) SYRINGE FOR IV PUSH (FOR BLOOD PRESSURE SUPPORT)
PREFILLED_SYRINGE | INTRAVENOUS | Status: AC
  Filled 2015-06-20: qty 10

## 2015-06-20 MED ORDER — FERROUS SULFATE 325 (65 FE) MG PO TABS
325.0000 mg | ORAL_TABLET | Freq: Three times a day (TID) | ORAL | Status: DC
  Administered 2015-06-22: 325 mg via ORAL
  Filled 2015-06-20 (×8): qty 1

## 2015-06-20 MED ORDER — HYDROCODONE-ACETAMINOPHEN 7.5-325 MG PO TABS: 1.0000 | ORAL_TABLET | ORAL | Status: DC | PRN

## 2015-06-20 MED ORDER — FAMOTIDINE 20 MG PO TABS
20.0000 mg | ORAL_TABLET | Freq: Two times a day (BID) | ORAL | Status: DC
  Administered 2015-06-20 – 2015-06-22 (×3): 20 mg via ORAL
  Filled 2015-06-20 (×5): qty 1

## 2015-06-20 MED ORDER — SODIUM CHLORIDE 0.9 % IR SOLN
Status: DC | PRN
  Administered 2015-06-20: 1000 mL

## 2015-06-20 MED ORDER — BUPIVACAINE-EPINEPHRINE (PF) 0.25% -1:200000 IJ SOLN
INTRAMUSCULAR | Status: AC
  Filled 2015-06-20: qty 30

## 2015-06-20 MED ORDER — ONDANSETRON HCL 4 MG/2ML IJ SOLN
INTRAMUSCULAR | Status: AC
  Filled 2015-06-20: qty 2

## 2015-06-20 MED ORDER — PROPOFOL 10 MG/ML IV BOLUS
INTRAVENOUS | Status: AC
  Filled 2015-06-20: qty 20

## 2015-06-20 MED ORDER — METHOCARBAMOL 500 MG PO TABS
500.0000 mg | ORAL_TABLET | Freq: Four times a day (QID) | ORAL | Status: DC | PRN
  Administered 2015-06-20 – 2015-06-22 (×6): 500 mg via ORAL
  Filled 2015-06-20 (×6): qty 1

## 2015-06-20 MED ORDER — FENTANYL CITRATE (PF) 100 MCG/2ML IJ SOLN
INTRAMUSCULAR | Status: AC
  Filled 2015-06-20: qty 4

## 2015-06-20 MED ORDER — BUPIVACAINE-EPINEPHRINE (PF) 0.25% -1:200000 IJ SOLN
INTRAMUSCULAR | Status: DC | PRN
  Administered 2015-06-20: 30 mL

## 2015-06-20 MED ORDER — CEFAZOLIN SODIUM 1-5 GM-% IV SOLN
1.0000 g | Freq: Four times a day (QID) | INTRAVENOUS | Status: AC
  Administered 2015-06-20 (×2): 1 g via INTRAVENOUS
  Filled 2015-06-20 (×2): qty 50

## 2015-06-20 MED ORDER — TRANEXAMIC ACID 1000 MG/10ML IV SOLN
1000.0000 mg | Freq: Once | INTRAVENOUS | Status: AC
  Administered 2015-06-20: 1000 mg via INTRAVENOUS
  Filled 2015-06-20: qty 10

## 2015-06-20 MED ORDER — POLYETHYLENE GLYCOL 3350 17 G PO PACK: 17.0000 g | PACK | Freq: Two times a day (BID) | ORAL | Status: DC

## 2015-06-20 MED ORDER — ASPIRIN EC 325 MG PO TBEC
325.0000 mg | DELAYED_RELEASE_TABLET | Freq: Two times a day (BID) | ORAL | Status: DC
  Administered 2015-06-20 – 2015-06-22 (×5): 325 mg via ORAL
  Filled 2015-06-20 (×6): qty 1

## 2015-06-20 MED ORDER — ROSUVASTATIN CALCIUM 20 MG PO TABS
20.0000 mg | ORAL_TABLET | Freq: Every day | ORAL | Status: DC
  Administered 2015-06-21 – 2015-06-22 (×2): 20 mg via ORAL
  Filled 2015-06-20 (×2): qty 1

## 2015-06-20 MED ORDER — MIDAZOLAM HCL 5 MG/5ML IJ SOLN
INTRAMUSCULAR | Status: DC | PRN
  Administered 2015-06-20: 2 mg via INTRAVENOUS

## 2015-06-20 MED ORDER — ONDANSETRON HCL 4 MG/2ML IJ SOLN
4.0000 mg | Freq: Four times a day (QID) | INTRAMUSCULAR | Status: DC | PRN
  Administered 2015-06-21: 4 mg via INTRAVENOUS
  Filled 2015-06-20: qty 2

## 2015-06-20 MED ORDER — LACTATED RINGERS IV SOLN
INTRAVENOUS | Status: DC
  Administered 2015-06-20: 09:00:00 via INTRAVENOUS
  Administered 2015-06-20: 1000 mL via INTRAVENOUS
  Administered 2015-06-20: 10:00:00 via INTRAVENOUS

## 2015-06-20 MED ORDER — PROPOFOL 500 MG/50ML IV EMUL
INTRAVENOUS | Status: DC | PRN
  Administered 2015-06-20: 125 ug/kg/min via INTRAVENOUS

## 2015-06-20 MED ORDER — PHENOL 1.4 % MT LIQD
1.0000 | OROMUCOSAL | Status: DC | PRN
  Filled 2015-06-20: qty 177

## 2015-06-20 MED ORDER — METOCLOPRAMIDE HCL 5 MG/ML IJ SOLN: 5.0000 mg | Freq: Three times a day (TID) | INTRAMUSCULAR | Status: DC | PRN

## 2015-06-20 MED ORDER — KETOROLAC TROMETHAMINE 30 MG/ML IJ SOLN
INTRAMUSCULAR | Status: AC
  Filled 2015-06-20: qty 1

## 2015-06-20 MED ORDER — FENTANYL CITRATE (PF) 100 MCG/2ML IJ SOLN
INTRAMUSCULAR | Status: DC | PRN
  Administered 2015-06-20: 100 ug via INTRAVENOUS

## 2015-06-20 MED ORDER — MENTHOL 3 MG MT LOZG: 1.0000 | LOZENGE | OROMUCOSAL | Status: DC | PRN

## 2015-06-20 MED ORDER — CEFAZOLIN SODIUM-DEXTROSE 2-3 GM-% IV SOLR
INTRAVENOUS | Status: AC
Start: 2015-06-20 — End: 2015-06-20
  Filled 2015-06-20: qty 50

## 2015-06-20 MED ORDER — HYDROCODONE-ACETAMINOPHEN 7.5-325 MG PO TABS
1.0000 | ORAL_TABLET | ORAL | Status: DC | PRN
  Administered 2015-06-20 (×3): 1 via ORAL
  Administered 2015-06-21: 2 via ORAL
  Filled 2015-06-20 (×2): qty 1
  Filled 2015-06-20 (×2): qty 2

## 2015-06-20 MED ORDER — SODIUM CHLORIDE 0.9 % IJ SOLN
INTRAMUSCULAR | Status: DC | PRN
  Administered 2015-06-20: 30 mL

## 2015-06-20 MED ORDER — ACETAMINOPHEN 325 MG PO TABS: 650.0000 mg | ORAL_TABLET | Freq: Four times a day (QID) | ORAL | Status: DC | PRN

## 2015-06-20 MED ORDER — DOCUSATE SODIUM 100 MG PO CAPS: 100.0000 mg | ORAL_CAPSULE | Freq: Two times a day (BID) | ORAL | Status: DC

## 2015-06-20 MED ORDER — BUPIVACAINE IN DEXTROSE 0.75-8.25 % IT SOLN
INTRATHECAL | Status: DC | PRN
  Administered 2015-06-20: 2 mL via INTRATHECAL

## 2015-06-20 MED ORDER — TRAZODONE HCL 100 MG PO TABS
100.0000 mg | ORAL_TABLET | Freq: Every day | ORAL | Status: DC
  Administered 2015-06-20 (×2): 50 mg via ORAL
  Administered 2015-06-21: 100 mg via ORAL
  Filled 2015-06-20: qty 2
  Filled 2015-06-20: qty 1
  Filled 2015-06-20: qty 2
  Filled 2015-06-20 (×2): qty 1

## 2015-06-20 MED ORDER — DEXAMETHASONE SODIUM PHOSPHATE 10 MG/ML IJ SOLN
10.0000 mg | Freq: Once | INTRAMUSCULAR | Status: AC
  Administered 2015-06-20: 10 mg via INTRAVENOUS

## 2015-06-20 MED ORDER — LIP MEDEX EX OINT
TOPICAL_OINTMENT | CUTANEOUS | Status: AC
  Filled 2015-06-20: qty 7

## 2015-06-20 MED ORDER — SODIUM CHLORIDE 0.9 % IJ SOLN
INTRAMUSCULAR | Status: AC
  Filled 2015-06-20: qty 50

## 2015-06-20 MED ORDER — MIDAZOLAM HCL 2 MG/2ML IJ SOLN
INTRAMUSCULAR | Status: AC
  Filled 2015-06-20: qty 4

## 2015-06-20 MED ORDER — ACETAMINOPHEN 650 MG RE SUPP: 650.0000 mg | Freq: Four times a day (QID) | RECTAL | Status: DC | PRN

## 2015-06-20 MED ORDER — SODIUM CHLORIDE 0.9 % IV SOLN
INTRAVENOUS | Status: DC
Start: 2015-06-20 — End: 2015-06-21
  Administered 2015-06-20: 15:00:00 via INTRAVENOUS
  Filled 2015-06-20 (×2): qty 1000

## 2015-06-20 MED ORDER — PHENYLEPHRINE HCL 10 MG/ML IJ SOLN
INTRAMUSCULAR | Status: DC | PRN
  Administered 2015-06-20 (×5): 80 ug via INTRAVENOUS

## 2015-06-20 MED ORDER — HYDROMORPHONE HCL 1 MG/ML IJ SOLN
0.5000 mg | INTRAMUSCULAR | Status: DC | PRN
  Administered 2015-06-20 (×3): 1 mg via INTRAVENOUS
  Administered 2015-06-20: 0.5 mg via INTRAVENOUS
  Administered 2015-06-21 (×2): 1 mg via INTRAVENOUS
  Administered 2015-06-21 (×2): 2 mg via INTRAVENOUS
  Administered 2015-06-21: 1 mg via INTRAVENOUS
  Filled 2015-06-20 (×3): qty 1
  Filled 2015-06-20: qty 2
  Filled 2015-06-20: qty 1
  Filled 2015-06-20: qty 2
  Filled 2015-06-20 (×3): qty 1

## 2015-06-20 MED ORDER — METOCLOPRAMIDE HCL 10 MG PO TABS: 5.0000 mg | ORAL_TABLET | Freq: Three times a day (TID) | ORAL | Status: DC | PRN

## 2015-06-20 MED ORDER — HYDROMORPHONE HCL 1 MG/ML IJ SOLN: 0.2500 mg | INTRAMUSCULAR | Status: DC | PRN

## 2015-06-20 MED ORDER — DEXAMETHASONE SODIUM PHOSPHATE 10 MG/ML IJ SOLN
10.0000 mg | Freq: Once | INTRAMUSCULAR | Status: AC
  Administered 2015-06-21: 10 mg via INTRAVENOUS
  Filled 2015-06-20: qty 1

## 2015-06-20 MED ORDER — ALUM & MAG HYDROXIDE-SIMETH 200-200-20 MG/5ML PO SUSP: 30.0000 mL | ORAL | Status: DC | PRN

## 2015-06-20 MED ORDER — KETOROLAC TROMETHAMINE 30 MG/ML IJ SOLN
INTRAMUSCULAR | Status: DC | PRN
  Administered 2015-06-20: 30 mg via INTRAVENOUS

## 2015-06-20 MED ORDER — POLYETHYLENE GLYCOL 3350 17 G PO PACK
17.0000 g | PACK | Freq: Two times a day (BID) | ORAL | Status: DC
  Administered 2015-06-20 – 2015-06-22 (×3): 17 g via ORAL

## 2015-06-20 MED ORDER — ONDANSETRON HCL 4 MG/2ML IJ SOLN
INTRAMUSCULAR | Status: DC | PRN
  Administered 2015-06-20: 4 mg via INTRAVENOUS

## 2015-06-20 MED ORDER — DOCUSATE SODIUM 100 MG PO CAPS
100.0000 mg | ORAL_CAPSULE | Freq: Two times a day (BID) | ORAL | Status: DC
  Administered 2015-06-20 – 2015-06-21 (×3): 100 mg via ORAL

## 2015-06-20 MED ORDER — CEFAZOLIN SODIUM-DEXTROSE 2-3 GM-% IV SOLR
2.0000 g | INTRAVENOUS | Status: AC
  Administered 2015-06-20: 2 g via INTRAVENOUS

## 2015-06-20 MED ORDER — VENLAFAXINE HCL 75 MG PO TABS
75.0000 mg | ORAL_TABLET | Freq: Two times a day (BID) | ORAL | Status: DC
  Administered 2015-06-20 – 2015-06-22 (×4): 75 mg via ORAL
  Filled 2015-06-20 (×6): qty 1

## 2015-06-20 MED ORDER — 0.9 % SODIUM CHLORIDE (POUR BTL) OPTIME
TOPICAL | Status: DC | PRN
  Administered 2015-06-20: 1000 mL

## 2015-06-20 MED ORDER — DEXAMETHASONE SODIUM PHOSPHATE 10 MG/ML IJ SOLN
INTRAMUSCULAR | Status: AC
  Filled 2015-06-20: qty 1

## 2015-06-20 MED ORDER — PROMETHAZINE HCL 25 MG/ML IJ SOLN: 6.2500 mg | INTRAMUSCULAR | Status: DC | PRN

## 2015-06-20 SURGICAL SUPPLY — 55 items
BAG DECANTER FOR FLEXI CONT (MISCELLANEOUS) IMPLANT
BAG SPEC THK2 15X12 ZIP CLS (MISCELLANEOUS)
BAG ZIPLOCK 12X15 (MISCELLANEOUS) IMPLANT
BANDAGE ELASTIC 6 VELCRO ST LF (GAUZE/BANDAGES/DRESSINGS) ×2 IMPLANT
BANDAGE ESMARK 6X9 LF (GAUZE/BANDAGES/DRESSINGS) ×1 IMPLANT
BLADE SAW SGTL 13.0X1.19X90.0M (BLADE) ×2 IMPLANT
BNDG CMPR 9X6 STRL LF SNTH (GAUZE/BANDAGES/DRESSINGS) ×1
BNDG ELASTIC 6X15 VLCR STRL LF (GAUZE/BANDAGES/DRESSINGS) ×2 IMPLANT
BNDG ESMARK 6X9 LF (GAUZE/BANDAGES/DRESSINGS) ×2
BOWL SMART MIX CTS (DISPOSABLE) ×2 IMPLANT
CAPT KNEE TOTAL 3 ATTUNE ×2 IMPLANT
CEMENT HV SMART SET (Cement) ×4 IMPLANT
CUFF TOURN SGL QUICK 34 (TOURNIQUET CUFF) ×2
CUFF TRNQT CYL 34X4X40X1 (TOURNIQUET CUFF) ×1 IMPLANT
DECANTER SPIKE VIAL GLASS SM (MISCELLANEOUS) ×2 IMPLANT
DRAPE EXTREMITY T 121X128X90 (DRAPE) ×2 IMPLANT
DRAPE POUCH INSTRU U-SHP 10X18 (DRAPES) ×2 IMPLANT
DRAPE U-SHAPE 47X51 STRL (DRAPES) ×2 IMPLANT
DRSG AQUACEL AG ADV 3.5X10 (GAUZE/BANDAGES/DRESSINGS) ×2 IMPLANT
DURAPREP 26ML APPLICATOR (WOUND CARE) ×4 IMPLANT
ELECT REM PT RETURN 9FT ADLT (ELECTROSURGICAL) ×2
ELECTRODE REM PT RTRN 9FT ADLT (ELECTROSURGICAL) ×1 IMPLANT
FACESHIELD WRAPAROUND (MASK) ×10 IMPLANT
GLOVE BIOGEL PI IND STRL 7.5 (GLOVE) ×1 IMPLANT
GLOVE BIOGEL PI IND STRL 8.5 (GLOVE) ×1 IMPLANT
GLOVE BIOGEL PI INDICATOR 7.5 (GLOVE) ×1
GLOVE BIOGEL PI INDICATOR 8.5 (GLOVE) ×1
GLOVE ECLIPSE 8.0 STRL XLNG CF (GLOVE) ×2 IMPLANT
GLOVE ORTHO TXT STRL SZ7.5 (GLOVE) ×4 IMPLANT
GOWN SPEC L3 XXLG W/TWL (GOWN DISPOSABLE) ×2 IMPLANT
GOWN STRL REUS W/TWL LRG LVL3 (GOWN DISPOSABLE) ×2 IMPLANT
HANDPIECE INTERPULSE COAX TIP (DISPOSABLE) ×2
KIT BASIN OR (CUSTOM PROCEDURE TRAY) ×2 IMPLANT
LIQUID BAND (GAUZE/BANDAGES/DRESSINGS) ×2 IMPLANT
MANIFOLD NEPTUNE II (INSTRUMENTS) ×2 IMPLANT
NDL SAFETY ECLIPSE 18X1.5 (NEEDLE) ×1 IMPLANT
NEEDLE HYPO 18GX1.5 SHARP (NEEDLE) ×2
PACK TOTAL JOINT (CUSTOM PROCEDURE TRAY) ×2 IMPLANT
PEN SKIN MARKING BROAD (MISCELLANEOUS) ×2 IMPLANT
POSITIONER SURGICAL ARM (MISCELLANEOUS) ×2 IMPLANT
SET HNDPC FAN SPRY TIP SCT (DISPOSABLE) ×1 IMPLANT
SET PAD KNEE POSITIONER (MISCELLANEOUS) ×2 IMPLANT
SUCTION FRAZIER 12FR DISP (SUCTIONS) ×2 IMPLANT
SUT MNCRL AB 4-0 PS2 18 (SUTURE) ×2 IMPLANT
SUT VIC AB 1 CT1 36 (SUTURE) ×2 IMPLANT
SUT VIC AB 2-0 CT1 27 (SUTURE) ×6
SUT VIC AB 2-0 CT1 TAPERPNT 27 (SUTURE) ×3 IMPLANT
SUT VLOC 180 0 24IN GS25 (SUTURE) ×2 IMPLANT
SYR 50ML LL SCALE MARK (SYRINGE) ×2 IMPLANT
TOWEL OR 17X26 10 PK STRL BLUE (TOWEL DISPOSABLE) ×2 IMPLANT
TOWEL OR NON WOVEN STRL DISP B (DISPOSABLE) IMPLANT
TRAY FOLEY W/METER SILVER 14FR (SET/KITS/TRAYS/PACK) ×2 IMPLANT
WATER STERILE IRR 1500ML POUR (IV SOLUTION) ×2 IMPLANT
WRAP KNEE MAXI GEL POST OP (GAUZE/BANDAGES/DRESSINGS) ×2 IMPLANT
YANKAUER SUCT BULB TIP 10FT TU (MISCELLANEOUS) ×2 IMPLANT

## 2015-06-20 NOTE — Evaluation (Signed)
Physical Therapy Evaluation Patient Details Name: Tammy Herman MRN: [id number] DOB: 1966/06/20 Today's Date: 06/20/2015   History of Present Illness  49 yo female s/p L TKA 06/20/15  Clinical Impression  On eval POD 0, pt required Min assist for mobility-walked ~40 feet with RW. Pain rated 7/10 L thigh.     Follow Up Recommendations Home health PT    Equipment Recommendations  None recommended by PT    Recommendations for Other Services       Precautions / Restrictions Precautions Precautions: Knee Restrictions Weight Bearing Restrictions: No LLE Weight Bearing: Weight bearing as tolerated      Mobility  Bed Mobility Overal bed mobility: Needs Assistance Bed Mobility: Supine to Sit     Supine to sit: Min assist     General bed mobility comments: assist for L LE  Transfers Overall transfer level: Needs assistance Equipment used: Rolling walker (2 wheeled) Transfers: Sit to/from Stand Sit to Stand: Min assist         General transfer comment: assist to rise, stabilize, control descent. vcs safety, hand placement.  Ambulation/Gait Ambulation/Gait assistance: Min guard Ambulation Distance (Feet): 40 Feet Assistive device: Rolling walker (2 wheeled) Gait Pattern/deviations: Step-to pattern;Antalgic     General Gait Details: close guard for safety. vcs safety, sequence. fatigues fairly easily  Stairs            Wheelchair Mobility    Modified Rankin (Stroke Patients Only)       Balance                                             Pertinent Vitals/Pain Pain Assessment: 0-10 Pain Score: 7  Pain Location: L thigh Pain Descriptors / Indicators: Sore;Aching Pain Intervention(s): Monitored during session;Ice applied;Repositioned    Home Living Family/patient expects to be discharged to:: Private residence Living Arrangements: Spouse/significant other Available Help at Discharge: Family Type of Home: House Home Access:  Stairs to enter   [employer] of Steps: 1 small step Home Layout: One [place]: Environmental consultant - 2 wheels;Bedside commode      Prior Function Level of Independence: Independent               Hand Dominance        Extremity/Trunk Assessment   Upper Extremity Assessment: Defer to OT evaluation           Lower Extremity Assessment: LLE deficits/detail   LLE Deficits / Details: hip flex 3/5, moves ankle well  Cervical / Trunk Assessment: Normal  Communication   Communication: No difficulties  Cognition Arousal/Alertness: Awake/alert Behavior During Therapy: WFL for tasks assessed/performed Overall Cognitive Status: Within Functional Limits for tasks assessed                      General Comments      Exercises        Assessment/Plan    PT Assessment Patient needs continued PT services  PT Diagnosis Difficulty walking;Acute pain   PT Problem List Decreased strength;Decreased range of motion;Decreased activity tolerance;Decreased balance;Decreased mobility;Decreased knowledge of use of DME;Pain  PT Treatment Interventions DME instruction;Gait training;Stair training;Functional mobility training;Therapeutic activities;Patient/family education;Balance training;Therapeutic exercise   PT Goals (Current goals can be found in the Care Plan section) Acute Rehab PT Goals Patient Stated Goal: less pain. PT Goal Formulation: With patient/family Time For Goal Achievement: 06/27/15 Potential  to Achieve Goals: Good    Frequency 7X/week   Barriers to discharge        Co-evaluation               End of Session   Activity Tolerance: Patient limited by fatigue;Patient limited by pain Patient left: in chair;with call bell/phone within reach;with family/visitor present           Time: 4585-9292 PT Time Calculation (min) (ACUTE ONLY): 12 min   Charges:   PT Evaluation $Initial PT Evaluation Tier I: 1 Procedure     PT G Codes:         Weston Anna, MPT Pager: [phone]

## 2015-06-20 NOTE — Plan of Care (Signed)
Problem: Consults Goal: Diagnosis- Total Joint Replacement Left total knee     

## 2015-06-20 NOTE — Anesthesia Preprocedure Evaluation (Signed)
Anesthesia Evaluation  Patient identified by MRN, date of birth, ID band Patient awake    Reviewed: Allergy & Precautions, NPO status , Patient's Chart, lab work & pertinent test results  Airway Mallampati: II  TM Distance: >3 FB Neck ROM: Full    Dental no notable dental hx.    Pulmonary neg pulmonary ROS, former smoker,    Pulmonary exam normal breath sounds clear to auscultation       Cardiovascular negative cardio ROS Normal cardiovascular exam Rhythm:Regular Rate:Normal     Neuro/Psych negative neurological ROS  negative psych ROS   GI/Hepatic Neg liver ROS, GERD  ,  Endo/Other  negative endocrine ROS  Renal/GU negative Renal ROS  negative genitourinary   Musculoskeletal negative musculoskeletal ROS (+)   Abdominal   Peds negative pediatric ROS (+)  Hematology negative hematology ROS (+)   Anesthesia Other Findings   Reproductive/Obstetrics negative OB ROS                             Anesthesia Physical Anesthesia Plan  ASA: II  Anesthesia Plan: Spinal   Post-op Pain Management:    Induction: Intravenous  Airway Management Planned: Simple Face Mask  Additional Equipment:   Intra-op Plan:   Post-operative Plan:   Informed Consent: I have reviewed the patients History and Physical, chart, labs and discussed the procedure including the risks, benefits and alternatives for the proposed anesthesia with the patient or authorized representative who has indicated his/her understanding and acceptance.   Dental advisory given  Plan Discussed with: CRNA and Surgeon  Anesthesia Plan Comments:         Anesthesia Quick Evaluation

## 2015-06-20 NOTE — Anesthesia Postprocedure Evaluation (Signed)
  Anesthesia Post-op Note  Patient: Tammy Herman  Procedure(s) Performed: Procedure(s) (LRB): TOTAL LEFT KNEE ARTHROPLASTY (Left)  Patient Location: PACU  Anesthesia Type: Spinal  Level of Consciousness: awake and alert   Airway and Oxygen Therapy: Patient Spontanous Breathing  Post-op Pain: mild  Post-op Assessment: Post-op Vital signs reviewed, Patient's Cardiovascular Status Stable, Respiratory Function Stable, Patent Airway and No signs of Nausea or vomiting  Last Vitals:  Filed Vitals:   06/20/15 1413  BP: 115/81  Pulse: 75  Temp: 36.4 C  Resp: 16    Post-op Vital Signs: stable   Complications: No apparent anesthesia complications

## 2015-06-20 NOTE — Op Note (Signed)
NAME:  Tammy Herman                      MEDICAL RECORD NO.:  [id number]                             FACILITY:  [facility]      PHYSICIAN:  Pietro Cassis. Alvan Dame, M.D.  DATE OF BIRTH:  [birth year]      DATE OF PROCEDURE:  06/20/2015                                     OPERATIVE REPORT         PREOPERATIVE DIAGNOSIS:  Left knee osteoarthritis.      POSTOPERATIVE DIAGNOSIS:  Left knee osteoarthritis.      FINDINGS:  The patient was noted to have complete loss of cartilage and   bone-on-bone arthritis with associated osteophytes in the lateral and patellofemoral compartments of   the knee with a significant synovitis and associated effusion.      PROCEDURE:  Left total knee replacement.      COMPONENTS USED:  DePuy Attune rotating platform posterior stabilized knee   system, a size 4 femur, 5 tibia, size 12 mm PS AOX insert, and 35 anatomic patellar   button.      SURGEON:  Pietro Cassis. Alvan Dame, M.D.      ASSISTANT:  Surgical team      ANESTHESIA:  Spinal.      SPECIMENS:  None.      COMPLICATION:  None.      DRAINS:  None.  EBL: <100cc      TOURNIQUET TIME:   Total Tourniquet Time Documented: Thigh (Left) - 34 minutes Total: Thigh (Left) - 34 minutes      The patient was stable to the recovery room.      INDICATION FOR PROCEDURE:  Tammy Herman is a 49 y.o. female patient of   mine.  The patient had been seen, evaluated, and treated conservatively in the   office with medication, activity modification, and injections.  The patient had   radiographic changes of bone-on-bone arthritis with endplate sclerosis and osteophytes noted.      The patient failed conservative measures including medication, injections, and activity modification, and at this point was ready for more definitive measures.   Based on the radiographic changes and failed conservative measures, the patient   decided to proceed with total knee replacement.  Risks of infection,   DVT, component failure, need for  revision surgery, postop course, and   expectations were all   discussed and reviewed.  Consent was obtained for benefit of pain   relief.      PROCEDURE IN DETAIL:  The patient was brought to the operative theater.   Once adequate anesthesia, preoperative antibiotics, 2 gm of Ancef, 1 gm of Tranexamic Acid, and 10 mg of Decadron administered, the patient was positioned supine with the left thigh tourniquet placed.  The  left lower extremity was prepped and draped in sterile fashion.  A time-   out was performed identifying the patient, planned procedure, and   extremity.      The left lower extremity was placed in the [facility] leg holder.  The leg was   exsanguinated, tourniquet elevated to 250 mmHg.  A midline incision was   made followed by median  parapatellar arthrotomy.  Following initial   exposure, attention was first directed to the patella.  Precut   measurement was noted to be 21 mm.  I resected down to 14 mm and used a   7mm patellar button to restore patellar height as well as cover the cut   surface.      The lug holes were drilled and a metal shim was placed to protect the   patella from retractors and saw blades.      At this point, attention was now directed to the femur.  The femoral   canal was opened with a drill, irrigated to try to prevent fat emboli.  An   intramedullary rod was passed at 3 degrees valgus, 9 mm of bone was   resected off the distal femur.  Following this resection, the tibia was   subluxated anteriorly.  Using the extramedullary guide, 4 mm of bone was resected off   the proximal lateral tibia.  We confirmed the gap would be   stable medially and laterally with a size 7 mm insert as well as confirmed   the cut was perpendicular in the coronal plane, checking with an alignment rod.      Once this was done, I sized the femur to be a size 4 in the anterior-   posterior dimension, chose a standard component based on medial and   lateral dimension.   The size 4 rotation block was then pinned in   position anterior referenced using the C-clamp to set rotation.  The   anterior, posterior, and  chamfer cuts were made without difficulty nor   notching making certain that I was along the anterior cortex to help   with flexion gap stability.      The final box cut was made off the lateral aspect of distal femur.      At this point, the tibia was sized to be a size 5, the size 5 tray was   then pinned in position through the medial third of the tubercle,   drilled, and keel punched.  Trial reduction was now carried with a 4 femur,  5 tibia, a size 8 up to size 12 mm insert, and the 35 patella botton.  Pre-operatively she was noted to have about 10 degrees of hyper-extension.  I selected up to the 12 mm insert to try and create a more stable knee particularly in extension.  The knee was brought to   extension, full extension with good flexion stability with the patella   tracking through the trochlea without application of pressure.  Given   all these findings, the trial components removed.  Final components were   opened and cement was mixed.  The knee was irrigated with normal saline   solution and pulse lavage.  The synovial lining was   then injected with 30cc of 0.25% Marcaine with epinephrine and 1 cc of Toradol plus 30cc of NS for a   total of 61 cc.      The knee was irrigated.  Final implants were then cemented onto clean and   dried cut surfaces of bone with the knee brought to extension with a 12 mm trial insert.      Once the cement had fully cured, the excess cement was removed   throughout the knee.  I confirmed I was satisfied with the range of   motion and stability, and the final 12 mm AOX PS insert was chosen.  It was  placed into the knee.      The tourniquet had been let down at 34 minutes.  No significant   hemostasis required.  The   extensor mechanism was then reapproximated using #1 Vicryl and # 0 V-lock sutures  with the knee   in flexion.  The   remaining wound was closed with 2-0 Vicryl and running 4-0 Monocryl.   The knee was cleaned, dried, dressed sterilely using Dermabond and   Aquacel dressing.  The patient was then   brought to recovery room in stable condition, tolerating the procedure   well.               Pietro Cassis Alvan Dame, M.D.    06/20/2015 11:28 AM

## 2015-06-20 NOTE — Transfer of Care (Signed)
Immediate Anesthesia Transfer of Care Note  Patient: Tammy Herman  Procedure(s) Performed: Procedure(s): TOTAL LEFT KNEE ARTHROPLASTY (Left)  Patient Location: PACU  Anesthesia Type:General  Level of Consciousness:  sedated, patient cooperative and responds to stimulation  Airway & Oxygen Therapy:Patient Spontanous Breathing and Patient connected to face mask oxgen  Post-op Assessment:  Report given to PACU RN and Post -op Vital signs reviewed and stable  Post vital signs:  Reviewed and stable  Last Vitals:  Filed Vitals:   06/20/15 0722  Pulse: 98  Temp: 36.6 C  Resp: 18    Complications: No apparent anesthesia complications

## 2015-06-20 NOTE — Interval H&P Note (Signed)
History and Physical Interval Note:  06/20/2015 9:01 AM  Tammy Herman  has presented today for surgery, with the diagnosis of LEFT KNEE OA   The various methods of treatment have been discussed with the patient and family. After consideration of risks, benefits and other options for treatment, the patient has consented to  Procedure(s): TOTAL LEFT KNEE ARTHROPLASTY (Left) as a surgical intervention .  The patient's history has been reviewed, patient examined, no change in status, stable for surgery.  I have reviewed the patient's chart and labs.  Questions were answered to the patient's satisfaction.     Mauri Pole

## 2015-06-21 LAB — CBC
HCT: 39.2 % (ref 36.0–46.0)
Hemoglobin: 12.4 g/dL (ref 12.0–15.0)
MCH: 29.6 pg (ref 26.0–34.0)
MCHC: 31.6 g/dL (ref 30.0–36.0)
MCV: 93.6 fL (ref 78.0–100.0)
Platelets: 333 10*3/uL (ref 150–400)
RBC: 4.19 MIL/uL (ref 3.87–5.11)
RDW: 13.9 % (ref 11.5–15.5)
WBC: 13.1 10*3/uL — ABNORMAL HIGH (ref 4.0–10.5)

## 2015-06-21 LAB — BASIC METABOLIC PANEL
Anion gap: 6 (ref 5–15)
BUN: 12 mg/dL (ref 6–20)
CO2: 29 mmol/L (ref 22–32)
Calcium: 8.3 mg/dL — ABNORMAL LOW (ref 8.9–10.3)
Chloride: 107 mmol/L (ref 101–111)
Creatinine, Ser: 0.9 mg/dL (ref 0.44–1.00)
GFR calc Af Amer: >60 mL/min (ref >60)
GFR calc non Af Amer: >60 mL/min (ref >60)
Glucose, Bld: 106 mg/dL — ABNORMAL HIGH (ref 65–99)
Potassium: 4.1 mmol/L (ref 3.5–5.1)
Sodium: 142 mmol/L (ref 135–145)

## 2015-06-21 MED ORDER — FERROUS SULFATE 325 (65 FE) MG PO TABS: 325.0000 mg | ORAL_TABLET | Freq: Two times a day (BID) | ORAL | Status: DC

## 2015-06-21 MED ORDER — METHOCARBAMOL 500 MG PO TABS: 500.0000 mg | ORAL_TABLET | Freq: Four times a day (QID) | ORAL | Status: DC | PRN

## 2015-06-21 MED ORDER — ONDANSETRON HCL 4 MG PO TABS: 4.0000 mg | ORAL_TABLET | Freq: Four times a day (QID) | ORAL | Status: DC | PRN

## 2015-06-21 MED ORDER — ASPIRIN 325 MG PO TBEC: 325.0000 mg | DELAYED_RELEASE_TABLET | Freq: Two times a day (BID) | ORAL | Status: DC

## 2015-06-21 MED ORDER — ACETAMINOPHEN 325 MG PO TABS
650.0000 mg | ORAL_TABLET | Freq: Three times a day (TID) | ORAL | Status: DC
  Administered 2015-06-21 – 2015-06-22 (×4): 650 mg via ORAL
  Filled 2015-06-21 (×7): qty 2

## 2015-06-21 MED ORDER — OXYCODONE HCL 5 MG PO TABS: 5.0000 mg | ORAL_TABLET | ORAL | Status: DC | PRN

## 2015-06-21 MED ORDER — OXYCODONE HCL 5 MG PO TABS
5.0000 mg | ORAL_TABLET | ORAL | Status: DC | PRN
  Administered 2015-06-21 (×2): 10 mg via ORAL
  Administered 2015-06-21: 15 mg via ORAL
  Administered 2015-06-21: 5 mg via ORAL
  Administered 2015-06-21 – 2015-06-22 (×4): 15 mg via ORAL
  Filled 2015-06-21: qty 1
  Filled 2015-06-21 (×2): qty 3
  Filled 2015-06-21: qty 2
  Filled 2015-06-21 (×4): qty 3

## 2015-06-21 MED ORDER — KETOROLAC TROMETHAMINE 15 MG/ML IJ SOLN
15.0000 mg | Freq: Four times a day (QID) | INTRAMUSCULAR | Status: DC
  Administered 2015-06-21 (×2): 15 mg via INTRAVENOUS
  Filled 2015-06-21 (×3): qty 1

## 2015-06-21 MED ORDER — DIPHENHYDRAMINE HCL 25 MG PO CAPS
25.0000 mg | ORAL_CAPSULE | Freq: Four times a day (QID) | ORAL | Status: DC | PRN
  Administered 2015-06-21 – 2015-06-22 (×3): 25 mg via ORAL
  Filled 2015-06-21 (×3): qty 1

## 2015-06-21 NOTE — Progress Notes (Signed)
Patient ID: Tammy Herman, female   DOB: 1965-10-15, 49 y.o.   MRN: [id number] Subjective: 1 Day Post-Op Procedure(s) (LRB): TOTAL LEFT KNEE ARTHROPLASTY (Left)    Patient reports pain as moderate. Difficult time managing pain with Norco, requiring IV pain meds yesterday and last night. Feeling nauseated this am.  Objective:   VITALS:   Filed Vitals:   06/21/15 0604  BP: 105/71  Pulse: 87  Temp: 98.3 F (36.8 C)  Resp: 16    Neurovascular intact Incision: dressing C/D/I  LABS No results for input(s): HGB, HCT, WBC, PLT in the last 72 hours.  No results for input(s): NA, K, BUN, CREATININE, GLUCOSE in the last 72 hours.  No results for input(s): LABPT, INR in the last 72 hours.   Assessment/Plan: 1 Day Post-Op Procedure(s) (LRB): TOTAL LEFT KNEE ARTHROPLASTY (Left)   Advance diet Up with therapy Plan for discharge tomorrow   Will change pain med regiment to oxycodone and tylenol today to calm post op pain Plan for D/C tomorrow unless significant improvement today

## 2015-06-21 NOTE — Discharge Instructions (Signed)

## 2015-06-21 NOTE — Care Management Note (Addendum)
Case Management Note  Patient Details  Name: Tammy Herman MRN: [id number] Date of Birth: 1966-04-24  Subjective/Objective:                   TOTAL LEFT KNEE ARTHROPLASTY (Left) Action/Plan:  Discharge planning Expected Discharge Date:  06/22/15               Expected Discharge Plan:  [place]  In-House Referral:     Discharge planning Services  CM Consult  Post Acute Care Choice:  Home Health Choice offered to:  Patient  DME Arranged:  N/A DME Agency:     HH Arranged:  PT HH Agency:  [employer]  Status of Service:  Completed, signed off  Medicare Important Message Given:    Date Medicare IM Given:    Medicare IM give by:    Date Additional Medicare IM Given:    Additional Medicare Important Message give by:     If discussed at [place] of Stay Meetings, dates discussed:    Additional Comments: CM met with pt in room to offer choice of home health agency.  Pt chooses [facility] to render HHPT. Referral called to [employer], Tim.  Pt has both rolling walker and 3n1 from previous surgery.  Address and contact information verified by pt.  No other CM needs were communicated. Dellie Catholic, RN 06/21/2015, 9:39 AM

## 2015-06-21 NOTE — Progress Notes (Signed)
Physical Therapy Treatment Patient Details Name: Tammy Herman MRN: [id number] DOB: 1965/11/04 Today's Date: 06/21/2015    History of Present Illness 49 yo female s/p L TKA 06/20/15    PT Comments    Pt agreeable to bed exercises this am. Pt experiencing pain and nausea. Will attempt OOB/ambulation later today.   Follow Up Recommendations  Home health PT     Equipment Recommendations  None recommended by PT    Recommendations for Other Services       Precautions / Restrictions Precautions Precautions: Knee Restrictions Weight Bearing Restrictions: No LLE Weight Bearing: Weight bearing as tolerated    Mobility  Bed Mobility Overal bed mobility: Needs Assistance Bed Mobility: Sit to Supine       Sit to supine: Min assist   General bed mobility comments: assist for L LE  Transfers Overall transfer level: Needs assistance Equipment used: Rolling walker (2 wheeled) Transfers: Sit to/from [facility] Sit to Stand: Min assist Stand pivot transfers: Min guard       General transfer comment: assist to rise, stabilize, control descent. vcs safety, hand placement. Stand pivot from  recliner to bed with RW.   Ambulation/Gait                 Stairs            Wheelchair Mobility    Modified Rankin (Stroke Patients Only)       Balance                                    Cognition Arousal/Alertness: Awake/alert Behavior During Therapy: WFL for tasks assessed/performed Overall Cognitive Status: Within Functional Limits for tasks assessed                      Exercises Total Joint Exercises Ankle Circles/Pumps: AROM;Both;10 reps;Supine Quad Sets: AROM;Both;10 reps;Supine Heel Slides: AAROM;Left;10 reps;Supine Hip ABduction/ADduction: AAROM;Left;10 reps;Supine Straight Leg Raises: AAROM;Left;10 reps;Supine Goniometric ROM: ~10-60 degrees    General Comments        Pertinent Vitals/Pain Pain  Assessment: 0-10 Pain Score: 7  Pain Location: L thigh Pain Descriptors / Indicators: Aching;Sore Pain Intervention(s): Monitored during session;Ice applied;Repositioned    Home Living                      Prior Function            PT Goals (current goals can now be found in the care plan section) Progress towards PT goals: Progressing toward goals    Frequency  7X/week    PT Plan Current plan remains appropriate    Co-evaluation             End of Session   Activity Tolerance: Patient limited by pain (limited nausea) Patient left: in bed;with call bell/phone within reach;with family/visitor present     Time: 8182-9937 PT Time Calculation (min) (ACUTE ONLY): 15 min  Charges:  $Therapeutic Exercise: 8-22 mins                    G Codes:      Weston Anna, MPT Pager: [phone]

## 2015-06-21 NOTE — Evaluation (Addendum)
Occupational Therapy Evaluation Patient Details Name: Tammy Herman MRN: [id number] DOB: 02/14/1966 Today's Date: 06/21/2015    History of Present Illness 49 y.o. female s/p L TKA 06/20/15   Clinical Impression   Pt s/p above. Education provided in session and pt verbalized understanding of information provided. OT signing off and pt will have family to assist.    Follow Up Recommendations  No OT follow up;Supervision - Intermittent    Equipment Recommendations  None recommended by OT    Recommendations for Other Services       Precautions / Restrictions Precautions Precautions: Knee Precaution Booklet Issued: No Precaution Comments: educated on knee precauitons Restrictions Weight Bearing Restrictions: Yes LLE Weight Bearing: Weight bearing as tolerated      Mobility Bed Mobility Overal bed mobility: Needs Assistance Bed Mobility: Supine to Sit;Sit to Supine     Supine to sit: Min assist Sit to supine: Supervision   General bed mobility comments: Educated on techniques to help assist LLE. Assisted with LLE when getting to EOB.  Transfers Overall transfer level: Needs assistance Equipment used: Rolling walker (2 wheeled) Transfers: Sit to/from Stand Sit to Stand: Supervision Stand pivot transfers: Min guard       General transfer comment: RW in front for support.    Balance      Balance not formally assessed.                                      ADL Overall ADL's : Needs assistance/impaired Eating/Feeding: Independent;Sitting Eating/Feeding Details (indicate cue type and reason): drank water standing-supervision                 Lower Body Dressing: Set up;Supervision/safety;Sit to/from stand   Toilet Transfer: Supervision/safety;Ambulation;RW (sit to stand from bed)           Functional mobility during ADLs: Supervision/safety;Rolling walker General ADL Comments: Educated on safety such as safe footwear, use of bag on  walker, rugs/items on floor, and recommended someone be with her for tub transfer. Educated on tub transfer techniques and options for shower chair. Recommended pt not step over tub yet. Discussed benefit of reaching down to left foot to manage sock as it allows knee to bend.     Vision     Perception     Praxis      Pertinent Vitals/Pain Pain Assessment: 0-10 Pain Score: 5  Pain Location: Lt knee/thigh Pain Descriptors / Indicators: Aching;Throbbing;Tightness Pain Intervention(s): Monitored during session     Hand Dominance     Extremity/Trunk Assessment Upper Extremity Assessment Upper Extremity Assessment: Overall WFL for tasks assessed   Lower Extremity Assessment Lower Extremity Assessment: Defer to PT evaluation       Communication Communication Communication: No difficulties   Cognition Arousal/Alertness: Awake/alert Behavior During Therapy: WFL for tasks assessed/performed Overall Cognitive Status: Within Functional Limits for tasks assessed                     General Comments       Exercises       Shoulder Instructions      Home Living Family/patient expects to be discharged to:: Private residence Living Arrangements: Spouse/significant other Available Help at Discharge: Family Type of Home: House Home Access: Stairs to enter     Home Layout: One level     Bathroom Shower/Tub: Tub/shower unit Shower/tub characteristics: Architectural technologist: Standard  Home Equipment: [place] - 2 wheels;Bedside commode; AE-reacher           Prior Functioning/Environment Level of Independence: Independent             OT Diagnosis: Acute pain   OT Problem List:     OT Treatment/Interventions:      OT Goals(Current goals can be found in the care plan section)    OT Frequency:     Barriers to D/C:            Co-evaluation              End of Session Equipment Utilized During Treatment: Gait belt;Rolling  walker  Activity Tolerance: Patient tolerated treatment well Patient left: in bed;with family/visitor present   Time: 5053-9767 OT Time Calculation (min): 15 min Charges:  OT General Charges $OT Visit: 1 Procedure OT Evaluation $Initial OT Evaluation Tier I: 1 Procedure G-CodesBenito Mccreedy OTR/L [id number] 06/21/2015, 2:16 PM

## 2015-06-21 NOTE — Progress Notes (Signed)
OT Cancellation Note  Patient Details Name: Tammy Herman MRN: [id number] DOB: 12-May-1966   Cancelled Treatment:    Reason Eval/Treat Not Completed: Other (comment) (Pt had just returned to bed. requested OT to come back.)  Benito Mccreedy OTR/L [phone] 06/21/2015, 12:11 PM

## 2015-06-21 NOTE — Progress Notes (Signed)
Physical Therapy Treatment Patient Details Name: Tammy Herman MRN: [id number] DOB: March 05, 1966 Today's Date: 06/21/2015    History of Present Illness 49 y.o. female s/p L TKA 06/20/15    PT Comments    Progressing with mobility. Pt feeling better this pm.   Follow Up Recommendations  Home health PT     Equipment Recommendations  None recommended by PT    Recommendations for Other Services       Precautions / Restrictions Precautions Precautions: Knee Precaution Booklet Issued: No Precaution Comments: educated on knee precauitons Restrictions Weight Bearing Restrictions: No LLE Weight Bearing: Weight bearing as tolerated    Mobility  Bed Mobility Overal bed mobility: Needs Assistance Bed Mobility: Supine to Sit     Supine to sit: Min guard Sit to supine: Supervision   General bed mobility comments: Educated on techniques to help assist LLE. Pt used UE to assist L LE off bed  Transfers Overall transfer level: Needs assistance Equipment used: Rolling walker (2 wheeled) Transfers: Sit to/from Stand Sit to Stand: Min guard Stand pivot transfers: Min guard       General transfer comment: close guard for safety. VCs safety, hand placement  Ambulation/Gait Ambulation/Gait assistance: Min guard Ambulation Distance (Feet): 80 Feet Assistive device: Rolling walker (2 wheeled) Gait Pattern/deviations: Step-to pattern;Antalgic;Trunk flexed     General Gait Details: close guard for safety. Vcs safety, sequence.    Stairs            Wheelchair Mobility    Modified Rankin (Stroke Patients Only)       Balance                                    Cognition Arousal/Alertness: Awake/alert Behavior During Therapy: WFL for tasks assessed/performed Overall Cognitive Status: Within Functional Limits for tasks assessed                      Exercises Total Joint Exercises Ankle Circles/Pumps: AROM;Both;10 reps;Supine Quad Sets:  AROM;Both;10 reps;Supine Heel Slides: AAROM;Left;10 reps;Supine Hip ABduction/ADduction: AAROM;Left;10 reps;Supine Straight Leg Raises: AAROM;Left;10 reps;Supine Goniometric ROM: ~10-60 degrees    General Comments        Pertinent Vitals/Pain Pain Assessment: 0-10 Pain Score: 5  Pain Location: L thigh Pain Descriptors / Indicators: Aching;Sore Pain Intervention(s): Monitored during session;Ice applied;Repositioned    Home Living Family/patient expects to be discharged to:: Private residence Living Arrangements: Spouse/significant other Available Help at Discharge: Family Type of Home: House Home Access: Stairs to enter   Home Layout: One level Home Equipment: Environmental consultant - 2 wheels;Bedside commode      Prior Function Level of Independence: Independent          PT Goals (current goals can now be found in the care plan section) Progress towards PT goals: Progressing toward goals    Frequency  7X/week    PT Plan Current plan remains appropriate    Co-evaluation             End of Session   Activity Tolerance: Patient tolerated treatment well Patient left: in chair;with call bell/phone within reach     Time: 6144-3154 PT Time Calculation (min) (ACUTE ONLY): 15 min  Charges:  $Gait Training: 8-22 mins $Therapeutic Exercise: 8-22 mins                    G Codes:      Weston Anna, MPT  Pager: [phone]

## 2015-06-22 MED ORDER — POLYETHYLENE GLYCOL 3350 17 G PO PACK: 17.0000 g | PACK | Freq: Two times a day (BID) | ORAL | Status: DC

## 2015-06-22 MED ORDER — DOCUSATE SODIUM 100 MG PO CAPS: 100.0000 mg | ORAL_CAPSULE | Freq: Two times a day (BID) | ORAL | Status: DC

## 2015-06-22 MED ORDER — DIPHENHYDRAMINE HCL 25 MG PO CAPS: 25.0000 mg | ORAL_CAPSULE | Freq: Four times a day (QID) | ORAL | Status: DC | PRN

## 2015-06-22 NOTE — Progress Notes (Signed)
Physical Therapy Treatment Patient Details Name: Tammy Herman MRN: [id number] DOB: 1965-11-28 Today's Date: 06/22/2015    History of Present Illness 49 y.o. female s/p L TKA 06/20/15    PT Comments    Progressing with mobility. Pt tolerated activity fairly well. Practiced ambulation and exercises. All education completed  Follow Up Recommendations  Home health PT     Equipment Recommendations  None recommended by PT    Recommendations for Other Services       Precautions / Restrictions Precautions Precautions: Knee Restrictions Weight Bearing Restrictions: No LLE Weight Bearing: Weight bearing as tolerated    Mobility  Bed Mobility Overal bed mobility: Needs Assistance Bed Mobility: Supine to Sit;Sit to Supine     Supine to sit: Min guard Sit to supine: Min guard   General bed mobility comments: Educated on techniques to help assist LLE. Pt used UE to assist L LE off bed  Transfers Overall transfer level: Needs assistance Equipment used: Rolling walker (2 wheeled) Transfers: Sit to/from Stand Sit to Stand: Min guard Stand pivot transfers: Min guard       General transfer comment: close guard for safety. VCs safety, hand placement  Ambulation/Gait Ambulation/Gait assistance: Min guard Ambulation Distance (Feet): 85 Feet Assistive device: Rolling walker (2 wheeled) Gait Pattern/deviations: Step-to pattern;Antalgic     General Gait Details: close guard for safety. Vcs safety, sequence.    Stairs Stairs:  (Verbally reviewed technique for climbing 1 small step with RW)          Wheelchair Mobility    Modified Rankin (Stroke Patients Only)       Balance                                    Cognition Arousal/Alertness: Awake/alert Behavior During Therapy: WFL for tasks assessed/performed Overall Cognitive Status: Within Functional Limits for tasks assessed                      Exercises Total Joint Exercises Ankle  Circles/Pumps: AROM;Both;10 reps;Supine Quad Sets: AROM;Both;10 reps;Supine Heel Slides: AAROM;Left;10 reps;Supine Hip ABduction/ADduction: AAROM;Left;10 reps;Supine Straight Leg Raises: AAROM;Left;10 reps;Supine Goniometric ROM: ~10-70 degrees    General Comments        Pertinent Vitals/Pain Pain Assessment: 0-10 Pain Score: 5  Pain Location: L thigh Pain Descriptors / Indicators: Aching;Sore Pain Intervention(s): Monitored during session;Ice applied;Repositioned    Home Living                      Prior Function            PT Goals (current goals can now be found in the care plan section) Progress towards PT goals: Progressing toward goals    Frequency  7X/week    PT Plan Current plan remains appropriate    Co-evaluation             End of Session   Activity Tolerance: Patient tolerated treatment well Patient left: in bed;with call bell/phone within reach;with family/visitor present     Time: 6073-7106 PT Time Calculation (min) (ACUTE ONLY): 23 min  Charges:  $Gait Training: 8-22 mins $Therapeutic Exercise: 8-22 mins                    G Codes:      Tammy Herman, MPT Pager: [phone]

## 2015-06-22 NOTE — Plan of Care (Signed)
Problem: Consults Goal: Diagnosis- Total Joint Replacement Outcome: Completed/Met Date Met:  06/22/15 Primary Total Knee RIGHT

## 2015-06-22 NOTE — Plan of Care (Signed)
Problem: Phase III Progression Outcomes Goal: Anticoagulant follow-up in place Outcome: Not Applicable Date Met:  06/22/15 ASA VTE, no f/u needed.     

## 2015-06-22 NOTE — Progress Notes (Signed)
Patient ID: Tammy Herman, female   DOB: October 06, 1965, 49 y.o.   MRN: [id number] Subjective: 2 Days Post-Op Procedure(s) (LRB): TOTAL LEFT KNEE ARTHROPLASTY (Left)    Patient reports pain as moderate in bed laying, burning pains.  Itching with pain meds  Objective:   VITALS:   Filed Vitals:   06/22/15 0400  BP: 102/65  Pulse: 81  Temp: 98.4 F (36.9 C)  Resp: 16    Neurovascular intact Incision: scant drainage on dressing   LABS  Recent Labs  06/21/15 1037  HGB 12.4  HCT 39.2  WBC 13.1*  PLT 333     Recent Labs  06/21/15 1037  NA 142  K 4.1  BUN 12  CREATININE 0.90  GLUCOSE 106*    No results for input(s): LABPT, INR in the last 72 hours.   Assessment/Plan: 2 Days Post-Op Procedure(s) (LRB): TOTAL LEFT KNEE ARTHROPLASTY (Left)   Up with therapy Discharge home with home health   Rx on chart RTC in 2 weeks

## 2015-07-03 NOTE — Discharge Summary (Signed)
Physician Discharge Summary  Patient ID: Tammy Herman MRN: [id number] DOB/AGE: 49/18/1967 49 y.o.  Admit date: 06/20/2015 Discharge date: 06/22/2015   Procedures:  Procedure(s) (LRB): TOTAL LEFT KNEE ARTHROPLASTY (Left)  Attending Physician:  Dr. Paralee Cancel   Admission Diagnoses:   Left knee primary OA / pain  Discharge Diagnoses:  Active Problems:   History of total left knee replacement  Past Medical History  Diagnosis Date  . Depression   . Amenorrhea   . GERD (gastroesophageal reflux disease)   . Arthritis   . Hyperlipidemia     HPI:    Tammy Herman, 49 y.o. female, has a history of pain and functional disability in the left knee due to arthritis and has failed non-surgical conservative treatments for greater than 12 weeks to include NSAID's and/or analgesics, corticosteriod injections, viscosupplementation injections and activity modification. Onset of symptoms was gradual, starting 3+ years ago with gradually worsening course since that time. The patient noted prior procedures on the knee to include arthroscopy and menisectomy on the left knee(s). Patient currently rates pain in the left knee(s) at 8 out of 10 with activity. Patient has night pain, worsening of pain with activity and weight bearing, pain that interferes with activities of daily living, pain with passive range of motion, crepitus and joint swelling. Patient has evidence of periarticular osteophytes and joint space narrowing by imaging studies. There is no active infection. Risks, benefits and expectations were discussed with the patient. Risks including but not limited to the risk of anesthesia, blood clots, nerve damage, blood vessel damage, failure of the prosthesis, infection and up to and including death. Patient understand the risks, benefits and expectations and wishes to proceed with surgery.   PCP: Tivis Ringer, MD   Discharged Condition: good  Hospital Course:  Patient underwent  the above stated procedure on 06/20/2015. Patient tolerated the procedure well and brought to the recovery room in good condition and subsequently to the floor.  POD #1 BP: 105/71 ; Pulse: 87 ; Temp: 98.3 F (36.8 C) ; Resp: 16 Patient reports pain as moderate. Difficult time managing pain with Norco, requiring IV pain meds yesterday and last night. Feeling nauseated this am. Neurovascular intact and incision: dressing C/D/I  LABS  Basename    HGB  12.4  HCT  39.2   POD #2  BP: 102/65 ; Pulse: 81 ; Temp: 98.4 F (36.9 C) ; Resp: 16 Patient reports pain as moderate in bed laying, burning pains. Itching with pain meds.  Ready to be discharged home. Neurovascular intact and incision: dressing C/D/I  LABS   No new labs   Discharge Exam: General appearance: alert, cooperative and no distress Extremities: Homans sign is negative, no sign of DVT, no edema, redness or tenderness in the calves or thighs and no ulcers, gangrene or trophic changes  Disposition: Home with follow up in 2 weeks   Follow-up Information    Follow up with [facility].   Why:  home health physical therapy   Contact information:   [address] [place] [address] [phone]       Follow up with Mauri Pole, MD In 2 weeks.   Specialty:  Orthopedic Surgery   Why:  For wound check and to check progree   Contact information:   [address] [place] [address] [phone]       Discharge Instructions    Call MD / Call 911    Complete by:  As directed   If you experience chest pain or shortness of breath, CALL 911 and be transported to the hospital emergency room.  If you develope a fever above 101 F, pus (white drainage) or increased drainage or redness at the wound, or calf pain, call your surgeon's office.     Constipation Prevention    Complete by:  As directed   Drink plenty of fluids.  Prune juice may be helpful.  You may use a stool softener, such as  Colace (over the counter) 100 mg twice a day.  Use MiraLax (over the counter) for constipation as needed.     Discharge instructions    Complete by:  As directed   INSTRUCTIONS AFTER JOINT REPLACEMENT   Remove items at home which could result in a fall. This includes throw rugs or furniture in walking pathways ICE to the affected joint every three hours while awake for 30 minutes at a time, for at least the first 3-5 days, and then as needed for pain and swelling.  Continue to use ice for pain and swelling. You may notice swelling that will progress down to the foot and ankle.  This is normal after surgery.  Elevate your leg when you are not up walking on it.   Continue to use the breathing machine you got in the hospital (incentive spirometer) which will help keep your temperature down.  It is common for your temperature to cycle up and down following surgery, especially at night when you are not up moving around and exerting yourself.  The breathing machine keeps your lungs expanded and your temperature down.   DIET:  As you were doing prior to hospitalization, we recommend a well-balanced diet.  DRESSING / WOUND CARE / SHOWERING  Keep the surgical dressing until follow up.  The dressing is water proof, so you can shower without any extra covering.  IF THE DRESSING FALLS OFF or the wound gets wet inside, change the dressing with sterile gauze.  Please use good hand washing techniques before changing the dressing.  Do not use any lotions or creams on the incision until instructed by your surgeon.    ACTIVITY  Increase activity slowly as tolerated, but follow the weight bearing instructions below.   No driving for 6 weeks or until further direction given by your physician.  You cannot drive while taking narcotics.  No lifting or carrying greater than 10 lbs. until further directed by your surgeon. Avoid periods of inactivity such as sitting longer than an hour when not asleep. This helps prevent  blood clots.  You may return to work once you are authorized by your doctor.     WEIGHT BEARING   Weight bearing as tolerated with assist device (walker, cane, etc) as directed, use it as long as suggested by your surgeon or therapist, typically at least 4-6 weeks.   EXERCISES  Results after joint replacement surgery are often greatly improved when you follow the exercise, range of motion and muscle strengthening exercises prescribed by your doctor. Safety measures are also important to protect the joint from further injury. Any time any of these exercises cause you to have increased pain or swelling, decrease what you are doing until you are comfortable again and then slowly increase them. If you have problems or questions, call your caregiver or physical therapist for advice.   Rehabilitation is important following a joint replacement. After just a few days of immobilization, the muscles of the leg can become weakened and  shrink (atrophy).  These exercises are designed to build up the tone and strength of the thigh and leg muscles and to improve motion. Often times heat used for twenty to thirty minutes before working out will loosen up your tissues and help with improving the range of motion but do not use heat for the first two weeks following surgery (sometimes heat can increase post-operative swelling).   These exercises can be done on a training (exercise) mat, on the floor, on a table or on a bed. Use whatever works the best and is most comfortable for you.    Use music or television while you are exercising so that the exercises are a pleasant break in your day. This will make your life better with the exercises acting as a break in your routine that you can look forward to.   Perform all exercises about fifteen times, three times per day or as directed.  You should exercise both the operative leg and the other leg as well.   Exercises include:   Quad Sets - Tighten up the muscle on the  front of the thigh (Quad) and hold for 5-10 seconds.   Straight Leg Raises - With your knee straight (if you were given a brace, keep it on), lift the leg to 60 degrees, hold for 3 seconds, and slowly lower the leg.  Perform this exercise against resistance later as your leg gets stronger.  Leg Slides: Lying on your back, slowly slide your foot toward your buttocks, bending your knee up off the floor (only go as far as is comfortable). Then slowly slide your foot back down until your leg is flat on the floor again.  Angel Wings: Lying on your back spread your legs to the side as far apart as you can without causing discomfort.  Hamstring Strength:  Lying on your back, push your heel against the floor with your leg straight by tightening up the muscles of your buttocks.  Repeat, but this time bend your knee to a comfortable angle, and push your heel against the floor.  You may put a pillow under the heel to make it more comfortable if necessary.   A rehabilitation program following joint replacement surgery can speed recovery and prevent re-injury in the future due to weakened muscles. Contact your doctor or a physical therapist for more information on knee rehabilitation.    CONSTIPATION  Constipation is defined medically as fewer than three stools per week and severe constipation as less than one stool per week.  Even if you have a regular bowel pattern at home, your normal regimen is likely to be disrupted due to multiple reasons following surgery.  Combination of anesthesia, postoperative narcotics, change in appetite and fluid intake all can affect your bowels.   YOU MUST use at least one of the following options; they are listed in order of increasing strength to get the job done.  They are all available over the counter, and you may need to use some, POSSIBLY even all of these options:    Drink plenty of fluids (prune juice may be helpful) and high fiber foods Colace 100 mg by mouth twice a day   Senokot for constipation as directed and as needed Dulcolax (bisacodyl), take with full glass of water  Miralax (polyethylene glycol) once or twice a day as needed.  If you have tried all these things and are unable to have a bowel movement in the first 3-4 days after surgery call either your  surgeon or your primary doctor.    If you experience loose stools or diarrhea, hold the medications until you stool forms back up.  If your symptoms do not get better within 1 week or if they get worse, check with your doctor.  If you experience "the worst abdominal pain ever" or develop nausea or vomiting, please contact the office immediately for further recommendations for treatment.   ITCHING:  If you experience itching with your medications, try taking only a single pain pill, or even half a pain pill at a time.  You can also use Benadryl over the counter for itching or also to help with sleep.   TED HOSE STOCKINGS:  Use stockings on both legs until for at least 2 weeks or as directed by physician office. They may be removed at night for sleeping.  MEDICATIONS:  See your medication summary on the "After Visit Summary" that nursing will review with you.  You may have some home medications which will be placed on hold until you complete the course of blood thinner medication.  It is important for you to complete the blood thinner medication as prescribed.  PRECAUTIONS:  If you experience chest pain or shortness of breath - call 911 immediately for transfer to the hospital emergency department.   If you develop a fever greater that 101 F, purulent drainage from wound, increased redness or drainage from wound, foul odor from the wound/dressing, or calf pain - CONTACT YOUR SURGEON.                                                   FOLLOW-UP APPOINTMENTS:  If you do not already have a post-op appointment, please call the office for an appointment to be seen by your surgeon.  Guidelines for how soon to be seen  are listed in your "After Visit Summary", but are typically between 1-4 weeks after surgery.  OTHER INSTRUCTIONS:   Knee Replacement:  Do not place pillow under knee, focus on keeping the knee straight while resting. CPM instructions: 0-90 degrees, 2 hours in the morning, 2 hours in the afternoon, and 2 hours in the evening. Place foam block, curve side up under heel at all times except when in CPM or when walking.  DO NOT modify, tear, cut, or change the foam block in any way.  MAKE SURE YOU:  Understand these instructions.  Get help right away if you are not doing well or get worse.    Thank you for letting us be a part of your medical care team.  It is a privilege we respect greatly.  We hope these instructions will help you stay on track for a fast and full recovery!     Driving restrictions    Complete by:  As directed   No driving until further directed     Increase activity slowly as tolerated    Complete by:  As directed              Medication List    STOP taking these medications        HYDROcodone-acetaminophen 7.5-325 MG tablet  Commonly known as:  NORCO      TAKE these medications        aspirin 325 MG EC tablet  Take 1 tablet (325 mg total) by mouth 2 (two) times daily.  diphenhydrAMINE 25 mg capsule  Commonly known as:  BENADRYL  Take 1 capsule (25 mg total) by mouth every 6 (six) hours as needed for itching.     docusate sodium 100 MG capsule  Commonly known as:  COLACE  Take 1 capsule (100 mg total) by mouth 2 (two) times daily.     docusate sodium 100 MG capsule  Commonly known as:  COLACE  Take 1 capsule (100 mg total) by mouth 2 (two) times daily.     ferrous sulfate 325 (65 FE) MG tablet  Take 1 tablet (325 mg total) by mouth 2 (two) times daily.     methocarbamol 500 MG tablet  Commonly known as:  ROBAXIN  Take 1 tablet (500 mg total) by mouth every 6 (six) hours as needed for muscle spasms.     ondansetron 4 MG tablet  Commonly known  as:  ZOFRAN  Take 1 tablet (4 mg total) by mouth every 6 (six) hours as needed for nausea.     oxyCODONE 5 MG immediate release tablet  Commonly known as:  Oxy IR/ROXICODONE  Take 1-3 tablets (5-15 mg total) by mouth every 4 (four) hours as needed for severe pain.     polyethylene glycol packet  Commonly known as:  MIRALAX / GLYCOLAX  Take 17 g by mouth 2 (two) times daily.     polyethylene glycol packet  Commonly known as:  MIRALAX / GLYCOLAX  Take 17 g by mouth 2 (two) times daily.     ranitidine 150 MG tablet  Commonly known as:  ZANTAC  Take 150 mg by mouth daily.     rosuvastatin 20 MG tablet  Commonly known as:  CRESTOR  Take 20 mg by mouth daily.     traZODone 100 MG tablet  Commonly known as:  DESYREL  Take 100 mg by mouth at bedtime.     venlafaxine 75 MG tablet  Commonly known as:  EFFEXOR  Take 75 mg by mouth 2 (two) times daily.         Signed: West Pugh. Sophia Sperry   PA-C  07/03/2015, 5:27 PM

## 2016-07-23 ENCOUNTER — Encounter (HOSPITAL_COMMUNITY): Payer: Self-pay

## 2016-07-23 ENCOUNTER — Emergency Department (HOSPITAL_COMMUNITY): Payer: 59

## 2016-07-23 ENCOUNTER — Emergency Department (HOSPITAL_COMMUNITY)
Admission: EM | Admit: 2016-07-23 | Discharge: 2016-07-23 | Disposition: A | Discharge status: Home / Self Care | Payer: 59 | Attending: Emergency Medicine | Admitting: Emergency Medicine

## 2016-07-23 DIAGNOSIS — Z87891 Personal history of nicotine dependence: Secondary | ICD-10-CM | POA: Insufficient documentation

## 2016-07-23 DIAGNOSIS — M545 Low back pain, unspecified: Secondary | ICD-10-CM

## 2016-07-23 DIAGNOSIS — R0781 Pleurodynia: Secondary | ICD-10-CM | POA: Insufficient documentation

## 2016-07-23 DIAGNOSIS — R109 Unspecified abdominal pain: Secondary | ICD-10-CM | POA: Diagnosis not present

## 2016-07-23 DIAGNOSIS — Z791 Long term (current) use of non-steroidal anti-inflammatories (NSAID): Secondary | ICD-10-CM | POA: Diagnosis not present

## 2016-07-23 LAB — URINALYSIS, ROUTINE W REFLEX MICROSCOPIC
Bilirubin Urine: NEGATIVE
Glucose, UA: NEGATIVE mg/dL
Hgb urine dipstick: NEGATIVE
Ketones, ur: NEGATIVE mg/dL
Leukocytes, UA: NEGATIVE
Nitrite: NEGATIVE
Protein, ur: NEGATIVE mg/dL
Specific Gravity, Urine: 1.016 (ref 1.005–1.030)
pH: 6 (ref 5.0–8.0)

## 2016-07-23 MED ORDER — MORPHINE SULFATE (PF) 2 MG/ML IV SOLN
4.0000 mg | Freq: Once | INTRAVENOUS | Status: AC
  Administered 2016-07-23: 4 mg via INTRAVENOUS
  Filled 2016-07-23: qty 2

## 2016-07-23 MED ORDER — METHOCARBAMOL 500 MG PO TABS: 500.0000 mg | ORAL_TABLET | Freq: Two times a day (BID) | ORAL | 0 refills | Status: DC | PRN

## 2016-07-23 MED ORDER — LIDOCAINE 5 % EX PTCH
1.0000 | MEDICATED_PATCH | Freq: Once | CUTANEOUS | Status: DC
  Administered 2016-07-23: 1 via TRANSDERMAL
  Filled 2016-07-23: qty 1

## 2016-07-23 MED ORDER — OXYCODONE-ACETAMINOPHEN 5-325 MG PO TABS: 2.0000 | ORAL_TABLET | Freq: Three times a day (TID) | ORAL | 0 refills | Status: DC | PRN

## 2016-07-23 MED ORDER — MELOXICAM 15 MG PO TABS: 15.0000 mg | ORAL_TABLET | Freq: Every day | ORAL | 0 refills | Status: DC

## 2016-07-23 MED ORDER — OXYCODONE HCL 5 MG PO TABS
5.0000 mg | ORAL_TABLET | Freq: Once | ORAL | Status: AC
  Administered 2016-07-23: 5 mg via ORAL
  Filled 2016-07-23: qty 1

## 2016-07-23 NOTE — ED Notes (Signed)
Patient transported to CT 

## 2016-07-23 NOTE — Discharge Instructions (Signed)
Take Celebrex daily as directed. It is often does to take this with food in order to prevent upset stomach. Robaxin as her muscle relaxer to take as needed. Use pain medication only as needed for severe pain-This can make you very drowsy - please do not drink alcohol, operate heavy machinery or drive on this medication. If you develop severe or worsening pain, low back pain with fever, numbness, weakness or inability to walk or urinate, you should return to the ER immediately.  Please follow up with your doctor this week for a recheck if still having symptoms.  COLD THERAPY DIRECTIONS:  Ice or gel packs can be used to reduce both pain and swelling. Ice is the most helpful within the first 24 to 48 hours after an injury or flareup from overusing a muscle or joint.  Ice is effective, has very few side effects, and is safe for most people to use.   If you expose your skin to cold temperatures for too long or without the proper protection, you can damage your skin or nerves. Watch for signs of skin damage due to cold.   HOME CARE INSTRUCTIONS  Follow these tips to use ice and cold packs safely.  Place a dry or damp towel between the ice and skin. A damp towel will cool the skin more quickly, so you may need to shorten the time that the ice is used.  For a more rapid response, add gentle compression to the ice.  Ice for no more than 10 to 20 minutes at a time. The bonier the area you are icing, the less time it will take to get the benefits of ice.  Check your skin after 5 minutes to make sure there are no signs of a poor response to cold or skin damage.  Rest 20 minutes or more in between uses.  Once your skin is numb, you can end your treatment. You can test numbness by very lightly touching your skin. The touch should be so light that you do not see the skin dimple from the pressure of your fingertip. When using ice, most people will feel these normal sensations in this order: cold, burning, aching, and  numbness.   Be aware that if you develop new symptoms, such as a fever, leg weakness, difficulty with or loss of control of your urine or bowels, abdominal pain, or more severe pain, you will need to seek medical attention and  / or return to the Emergency department.

## 2016-07-23 NOTE — ED Notes (Signed)
Patient transported to X-ray 

## 2016-07-23 NOTE — ED Provider Notes (Signed)
[facility] DEPT Provider Note   CSN: [id number] Arrival date & time: 07/23/16  [id number]     History   Chief Complaint Chief Complaint  Patient presents with  . Back Pain    HPI Tammy Herman is a 50 y.o. female.  The history is provided by the patient and medical records. No language interpreter was used.   Tammy Herman is a 50 y.o. female  with a PMH of arthritis, GERD, depression, HLD who presents to the Emergency Department complaining of left lower back pain. Patient states mild pain started 2-3 weeks ago with no inciting event or injury. She did not seek medical care at time of onset because symptoms were not bothering her very much. Advil was helping. This morning, she sneezed and felt excruciating burning pain to the left aspect of her back. Pain is worse with movement or palpation and improved with lying still. She denies upper or midline back pain, abdominal pain, dysuria, urinary frequency/hesitancy, vaginal discharge, fever, bowel or bladder incontinence, saddle anesthesia, numbness/tingling, weakness.   Past Medical History:  Diagnosis Date  . Amenorrhea   . Arthritis   . Depression   . GERD (gastroesophageal reflux disease)   . Hyperlipidemia     Patient Active Problem List   Diagnosis Date Noted  . History of total left knee replacement 06/20/2015  . S/P lateral meniscectomy of right knee 10/18/2014  . S/P right UKR 10/18/2014  . Status post unilateral knee replacement 10/18/2014  . Pure hypercholesterolemia 07/27/2013    Past Surgical History:  Procedure Laterality Date  . CARPAL TUNNEL RELEASE Left   . KNEE ARTHROSCOPY WITH LATERAL MENISECTOMY Right 10/18/2014   Procedure: RIGHT KNEE ARTHROSCOPY WITH LATERAL MENISECTOMY;  Surgeon: Mauri Pole, MD;  Location: [facility];  Service: Orthopedics;  Laterality: Right;  . KNEE SURGERY Left 08/2012  . PARTIAL KNEE ARTHROPLASTY Right 10/18/2014   Procedure: RIGHT MEDIAL COMPARTMENTAL UNI-KNEE ARTHROPLASTY;   Surgeon: Mauri Pole, MD;  Location: [facility];  Service: Orthopedics;  Laterality: Right;  . TOTAL KNEE ARTHROPLASTY Left 06/20/2015   Procedure: TOTAL LEFT KNEE ARTHROPLASTY;  Surgeon: Paralee Cancel, MD;  Location: [facility];  Service: Orthopedics;  Laterality: Left;  . TUBAL LIGATION  2001    OB History    Gravida Para Term Preterm AB Living   3 3 3     3    SAB TAB Ectopic Multiple Live Births           3       Home Medications    Prior to Admission medications   Medication Sig Start Date End Date Taking? Authorizing Provider  atorvastatin (LIPITOR) 40 MG tablet Take 40 mg by mouth daily. 06/06/16  Yes Historical Provider, MD  diclofenac sodium (VOLTAREN) 1 % GEL Apply 1 application topically 2 (two) times daily as needed for pain. 06/18/16  Yes Historical Provider, MD  docusate sodium (COLACE) 100 MG capsule Take 1 capsule (100 mg total) by mouth 2 (two) times daily. Patient taking differently: Take 100 mg by mouth every other day.  10/19/14  Yes Danae Orleans, PA-C  ranitidine (ZANTAC) 150 MG tablet Take 150 mg by mouth daily.   Yes Historical Provider, MD  traZODone (DESYREL) 100 MG tablet Take 100 mg by mouth at bedtime.   Yes Historical Provider, MD  venlafaxine (EFFEXOR) 75 MG tablet Take 75-150 mg by mouth 2 (two) times daily. Takes 150mg  in the morning and then 75mg  at night   Yes Historical Provider, MD  aspirin EC 325 MG EC tablet Take 1 tablet (325 mg total) by mouth 2 (two) times daily. Patient not taking: Reported on 07/23/2016 06/21/15   Paralee Cancel, MD  diphenhydrAMINE (BENADRYL) 25 mg capsule Take 1 capsule (25 mg total) by mouth every 6 (six) hours as needed for itching. Patient not taking: Reported on 07/23/2016 06/22/15   Paralee Cancel, MD  ferrous sulfate 325 (65 FE) MG tablet Take 1 tablet (325 mg total) by mouth 2 (two) times daily. Patient not taking: Reported on 07/23/2016 06/21/15   Paralee Cancel, MD  meloxicam (MOBIC) 15 MG tablet Take 1 tablet (15 mg total) by mouth  daily. 07/23/16   Ozella Almond Cybele Maule, PA-C  methocarbamol (ROBAXIN) 500 MG tablet Take 1 tablet (500 mg total) by mouth 2 (two) times daily as needed for muscle spasms. 07/23/16   Ozella Almond Mahrosh Donnell, PA-C  oxyCODONE-acetaminophen (PERCOCET/ROXICET) 5-325 MG tablet Take 2 tablets by mouth every 8 (eight) hours as needed for severe pain. 07/23/16   Ozella Almond Asenath Balash, PA-C  polyethylene glycol (MIRALAX / GLYCOLAX) packet Take 17 g by mouth 2 (two) times daily. Patient not taking: Reported on 07/23/2016 06/22/15   Paralee Cancel, MD    Family History Family History  Problem Relation Age of Onset  . Hypertension Mother   . Diabetes Father     Type 2  . Diabetes Sister     Type 2    Social History Social History  Substance Use Topics  . Smoking status: Former Smoker    Quit date: 07/27/2006  . Smokeless tobacco: Never Used  . Alcohol use 2.5 - 3.0 oz/week    5 - 6 Standard drinks or equivalent per week     Comment: occasional      Allergies   Review of patient's allergies indicates no known allergies.   Review of Systems Review of Systems  Constitutional: Negative for chills and fever.  HENT: Negative for congestion.   Eyes: Negative for visual disturbance.  Respiratory: Negative for cough and shortness of breath.   Cardiovascular: Negative.   Gastrointestinal: Negative for abdominal pain, nausea and vomiting.  Genitourinary: Negative for dysuria, frequency, pelvic pain, urgency and vaginal discharge.  Musculoskeletal: Positive for back pain. Negative for neck pain.  Skin: Negative for rash.  Neurological: Negative for headaches.     Physical Exam Updated Vital Signs BP 102/68   Pulse 84   Temp 99.2 F (37.3 C) (Oral) Comment: Simultaneous filing. User may not have seen previous data. Comment (Src): Simultaneous filing. User may not have seen previous data.  Resp 16   LMP 09/12/2014   SpO2 94%   Physical Exam  Constitutional: She is oriented to person, place, and  time. She appears well-developed and well-nourished. No distress.  HENT:  Head: Normocephalic and atraumatic.  Cardiovascular: Normal rate, regular rhythm, normal heart sounds and intact distal pulses.  Exam reveals no gallop and no friction rub.   No murmur heard. Pulmonary/Chest: Effort normal and breath sounds normal. No respiratory distress. She has no wheezes. She has no rales. She exhibits no tenderness.  Abdominal: Soft. She exhibits no distension. There is no tenderness.  No abdominal or CVA tenderness.  Musculoskeletal:  No C/T/L-spine tenderness. Tenderness to palpation of the left lumbar paraspinal musculature and left flank. Straight leg raises negative bilaterally for radicular symptoms. 5/5 muscle strength in all 4 extremities.  Neurological: She is alert and oriented to person, place, and time.  Bilateral lower extremities neurovascularly intact.  Skin: Skin is  warm and dry.  Nursing note and vitals reviewed.    ED Treatments / Results  Labs (all labs ordered are listed, but only abnormal results are displayed) Labs Reviewed  URINALYSIS, ROUTINE W REFLEX MICROSCOPIC (NOT AT [facility])    EKG  EKG Interpretation None       Radiology Dg Ribs Unilateral W/chest Left  Result Date: 07/23/2016 CLINICAL DATA:  Progressive left lateral rib pain for 2 weeks. No known trauma. EXAM: LEFT RIBS AND CHEST - 3+ VIEW COMPARISON:  Chest x-ray dated 06/07/2009 FINDINGS: No fracture or other bone lesions are seen involving the ribs. There is no evidence of pneumothorax or pleural effusion. Both lungs are clear. Heart size and mediastinal contours are within normal limits. IMPRESSION: Negative. Electronically Signed   By: Lorriane Shire M.D.   On: 07/23/2016 10:19   Ct Renal Stone Study  Result Date: 07/23/2016 CLINICAL DATA:  Left-sided back pain of 1 week duration. Worse after sneezing. EXAM: CT ABDOMEN AND PELVIS WITHOUT CONTRAST TECHNIQUE: Multidetector CT imaging of the abdomen and  pelvis was performed following the standard protocol without IV contrast. COMPARISON:  None. FINDINGS: Lower chest: No active process. Hepatobiliary: Normal without contrast. Pancreas: Normal Spleen: Normal Adrenals/Urinary Tract: Adrenal glands are normal. Kidneys are normal. No cyst, mass, stone or hydronephrosis. No stone in the course of either ureter or in the bladder. Stomach/Bowel: Normal Vascular/Lymphatic: Aortic atherosclerosis. No aneurysm. IVC is normal. No retroperitoneal mass or lymphadenopathy. Reproductive: Uterus and adnexal regions are normal. Other: No free fluid or air. Musculoskeletal: There is ordinary degenerative change of the lumbar spine, which could contribute to back pain. No evidence of fracture. IMPRESSION: No acute finding or cause of the presenting symptoms is identified. No evidence of abdominal or pelvic organ pathology. Aortic atherosclerosis. Chronic lumbar spine degenerative changes. Electronically Signed   By: Nelson Chimes M.D.   On: 07/23/2016 12:50    Procedures Procedures (including critical care time)  Medications Ordered in ED Medications  lidocaine (LIDODERM) 5 % 1 patch (1 patch Transdermal Patch Applied 07/23/16 1041)  oxyCODONE (Oxy IR/ROXICODONE) immediate release tablet 5 mg (5 mg Oral Given 07/23/16 1041)  morphine 2 MG/ML injection 4 mg (4 mg Intravenous Given 07/23/16 1204)     Initial Impression / Assessment and Plan / ED Course  I have reviewed the triage vital signs and the nursing notes.  Pertinent labs & imaging results that were available during my care of the patient were reviewed by me and considered in my medical decision making (see chart for details).  Clinical Course   Tammy Herman is a 50 y.o. female who presents to ED for left lower back pain. On exam, patient is afebrile, nontoxic appearing and hemodynamically stable. She has tenderness to palpation of the left lower back and rib cage/flank. Urine negative, CT stone study  negative. Appears more musculoskeletal in etiology. Patient reevaluated following pain medication and lidocaine patch. Pain and range of motion improved. Symptomatic home care instructions discussed. PCP follow-up encouraged him questions answered.  Final Clinical Impressions(s) / ED Diagnoses   Final diagnoses:  Acute left-sided low back pain without sciatica    New Prescriptions Discharge Medication List as of 07/23/2016  1:42 PM    START taking these medications   Details  methocarbamol (ROBAXIN) 500 MG tablet Take 1 tablet (500 mg total) by mouth 2 (two) times daily as needed for muscle spasms., Starting Mon 07/23/2016, Print    oxyCODONE-acetaminophen (PERCOCET/ROXICET) 5-325 MG tablet Take 2 tablets by  mouth every 8 (eight) hours as needed for severe pain., Starting Mon 07/23/2016, [employer] Myan Locatelli, PA-C 07/23/16 [place], MD 07/23/16 [phone]

## 2016-07-23 NOTE — ED Notes (Signed)
Patient is A & O x4.  She understood discharge instructions. 

## 2016-07-23 NOTE — ED Triage Notes (Signed)
Pt c/o L mid back pain x 1 week.  Sts pain increased significantly this morning after sneezing.  Pain score 10/10.  Pt reports taking Advil w/ some relief.  Denies injury.  Denies n/v/d.  Denies GU complaints.

## 2017-07-01 ENCOUNTER — Encounter: Payer: Self-pay | Admitting: Gastroenterology

## 2017-08-12 ENCOUNTER — Other Ambulatory Visit: Payer: Self-pay

## 2017-08-12 ENCOUNTER — Ambulatory Visit (AMBULATORY_SURGERY_CENTER): Payer: Self-pay | Admitting: *Deleted

## 2017-08-12 VITALS — Ht 66.0 in | Wt 213.0 lb

## 2017-08-12 DIAGNOSIS — Z1211 Encounter for screening for malignant neoplasm of colon: Secondary | ICD-10-CM

## 2017-08-12 MED ORDER — NA SULFATE-K SULFATE-MG SULF 17.5-3.13-1.6 GM/177ML PO SOLN: ORAL | 0 refills | Status: DC

## 2017-08-12 NOTE — Progress Notes (Signed)
Patient denies any allergies to eggs or soy. Patient denies any problems with anesthesia/sedation. Patient denies any oxygen use at home. Patient denies taking any diet/weight loss medications or blood thinners. EMMI education assisgned to patient on colonoscopy, this was explained and instructions given to patient. 

## 2017-08-21 ENCOUNTER — Encounter: Payer: Self-pay | Admitting: Gastroenterology

## 2017-08-26 ENCOUNTER — Other Ambulatory Visit: Payer: Self-pay

## 2017-08-26 ENCOUNTER — Ambulatory Visit (AMBULATORY_SURGERY_CENTER): Payer: Managed Care, Other (non HMO) | Admitting: Gastroenterology

## 2017-08-26 ENCOUNTER — Encounter: Payer: Self-pay | Admitting: Gastroenterology

## 2017-08-26 VITALS — BP 119/76 | HR 84 | Temp 98.0°F | Resp 12 | Ht 66.0 in | Wt 213.0 lb

## 2017-08-26 DIAGNOSIS — D122 Benign neoplasm of ascending colon: Secondary | ICD-10-CM | POA: Diagnosis not present

## 2017-08-26 DIAGNOSIS — Z1211 Encounter for screening for malignant neoplasm of colon: Secondary | ICD-10-CM | POA: Diagnosis present

## 2017-08-26 DIAGNOSIS — D123 Benign neoplasm of transverse colon: Secondary | ICD-10-CM | POA: Diagnosis not present

## 2017-08-26 MED ORDER — SODIUM CHLORIDE 0.9 % IV SOLN: 500.0000 mL | INTRAVENOUS | Status: DC

## 2017-08-26 NOTE — Patient Instructions (Signed)
Impression/Recommendations:  Polyp handout given to patient. Diverticulosis handout given to patient.  Resume previous diet. Continue present medications.  Repeat colonoscopy recommended for surveillance.  Date to be determined after pathology results reviewed.  No Ibuprofen, Naproxen, or other NSAID drugs for 2 weeks.  Tylenol only until Dec. 17, 2018.  YOU HAD AN ENDOSCOPIC PROCEDURE TODAY AT [facility]:   Refer to the procedure report that was given to you for any specific questions about what was found during the examination.  If the procedure report does not answer your questions, please call your gastroenterologist to clarify.  If you requested that your care partner not be given the details of your procedure findings, then the procedure report has been included in a sealed envelope for you to review at your convenience later.  YOU SHOULD EXPECT: Some feelings of bloating in the abdomen. Passage of more gas than usual.  Walking can help get rid of the air that was put into your GI tract during the procedure and reduce the bloating. If you had a lower endoscopy (such as a colonoscopy or flexible sigmoidoscopy) you may notice spotting of blood in your stool or on the toilet paper. If you underwent a bowel prep for your procedure, you may not have a normal bowel movement for a few days.  Please Note:  You might notice some irritation and congestion in your nose or some drainage.  This is from the oxygen used during your procedure.  There is no need for concern and it should clear up in a day or so.  SYMPTOMS TO REPORT IMMEDIATELY:   Following lower endoscopy (colonoscopy or flexible sigmoidoscopy):  Excessive amounts of blood in the stool  Significant tenderness or worsening of abdominal pains  Swelling of the abdomen that is new, acute  Fever of 100F or higher For urgent or emergent issues, a gastroenterologist can be reached at any hour by calling [phone]  [phone].   DIET:  We do recommend a small meal at first, but then you may proceed to your regular diet.  Drink plenty of fluids but you should avoid alcoholic beverages for 24 hours.  ACTIVITY:  You should plan to take it easy for the rest of today and you should NOT DRIVE or use heavy machinery until tomorrow (because of the sedation medicines used during the test).    FOLLOW UP: Our staff will call the number listed on your records the next business day following your procedure to check on you and address any questions or concerns that you may have regarding the information given to you following your procedure. If we do not reach you, we will leave a message.  However, if you are feeling well and you are not experiencing any problems, there is no need to return our call.  We will assume that you have returned to your regular daily activities without incident.  If any biopsies were taken you will be contacted by phone or by letter within the next 1-3 weeks.  Please call us at [phone] if you have not heard about the biopsies in 3 weeks.    SIGNATURES/CONFIDENTIALITY: You and/or your care partner have signed paperwork which will be entered into your electronic medical record.  These signatures attest to the fact that that the information above on your After Visit Summary has been reviewed and is understood.  Full responsibility of the confidentiality of this discharge information lies with you and/or your care-partner.

## 2017-08-26 NOTE — Progress Notes (Signed)
A/ox3 pleased with MAC, report to [employer]

## 2017-08-26 NOTE — Progress Notes (Signed)
Pt's states no medical or surgical changes since previsit or office visit. 

## 2017-08-26 NOTE — Progress Notes (Signed)
Called to room to assist during endoscopic procedure.  Patient ID and intended procedure confirmed with present staff. Received instructions for my participation in the procedure from the performing physician.  

## 2017-08-26 NOTE — Op Note (Signed)
[facility] Patient Name: Tammy Herman Procedure Date: 08/26/2017 8:25 AM MRN: [id number] Endoscopist: Remo Lipps P. Dan Dissinger MD, MD Age: 51 Referring MD:  Date of Birth: 06/08/1966 Gender: Female Account #: [id number] Procedure:                Colonoscopy Indications:              Screening for colorectal malignant neoplasm, This                            is the patient's first colonoscopy Medicines:                Monitored Anesthesia Care Procedure:                Pre-Anesthesia Assessment:                           - Prior to the procedure, a History and Physical                            was performed, and patient medications and                            allergies were reviewed. The patient's tolerance of                            previous anesthesia was also reviewed. The risks                            and benefits of the procedure and the sedation                            options and risks were discussed with the patient.                            All questions were answered, and informed consent                            was obtained. Prior Anticoagulants: The patient has                            taken no previous anticoagulant or antiplatelet                            agents. ASA Grade Assessment: II - A patient with                            mild systemic disease. After reviewing the risks                            and benefits, the patient was deemed in                            satisfactory condition to undergo the procedure.  After obtaining informed consent, the colonoscope                            was passed under direct vision. Throughout the                            procedure, the patient's blood pressure, pulse, and                            oxygen saturations were monitored continuously. The                            Model PCF-[id number]) scope was introduced                            through the anus  and advanced to the the cecum,                            identified by appendiceal orifice and ileocecal                            valve. The colonoscopy was performed without                            difficulty. The patient tolerated the procedure                            well. The quality of the bowel preparation was                            adequate. The ileocecal valve, appendiceal orifice,                            and rectum were photographed. Scope In: 8:32:23 AM Scope Out: 0:08:67 AM Scope Withdrawal Time: 0 hours 19 minutes 36 seconds  Total Procedure Duration: 0 hours 23 minutes 45 seconds  Findings:                 The perianal exam findings include skin tags.                           A 7 mm polyp was found in the ascending colon. The                            polyp was sessile. The polyp was removed with a                            cold snare. Resection and retrieval were complete.                           Two flat polyps were found in the transverse colon.                            The polyps were 4  to 5 mm in size. These polyps                            were removed with a cold snare. Resection and                            retrieval were complete.                           A few medium-mouthed diverticula were found in the                            sigmoid colon.                           The exam was otherwise without abnormality. Complications:            No immediate complications. Estimated blood loss:                            Minimal. Estimated Blood Loss:     Estimated blood loss was minimal. Impression:               - Perianal skin tags found on perianal exam.                           - One 7 mm polyp in the ascending colon, removed                            with a cold snare. Resected and retrieved.                           - Two 4 to 5 mm polyps in the transverse colon,                            removed with a cold snare. Resected and  retrieved.                           - Diverticulosis in the sigmoid colon.                           - The examination was otherwise normal. Recommendation:           - Patient has a contact number available for                            emergencies. The signs and symptoms of potential                            delayed complications were discussed with the                            patient. Return to normal activities tomorrow.  Written discharge instructions were provided to the                            patient.                           - Resume previous diet.                           - Continue present medications.                           - Await pathology results.                           - Repeat colonoscopy is recommended for                            surveillance. The colonoscopy date will be                            determined after pathology results from today's                            exam become available for review.                           - No ibuprofen, naproxen, or other non-steroidal                            anti-inflammatory drugs for 2 weeks after polyp                            removal. Remo Lipps P. Eural Holzschuh MD, MD 08/26/2017 8:59:33 AM This report has been signed electronically.

## 2017-08-27 ENCOUNTER — Telehealth: Payer: Self-pay

## 2017-08-27 NOTE — Telephone Encounter (Signed)
  Follow up Call-  Call back number 08/26/2017  Post procedure Call Back phone  # [phone]  Permission to leave phone message Yes  Some recent data might be hidden     Patient questions:  Do you have a fever, pain , or abdominal swelling? No. Pain Score  0 *  Have you tolerated food without any problems? Yes.    Have you been able to return to your normal activities? Yes.    Do you have any questions about your discharge instructions: Diet   No. Medications  No. Follow up visit  No.  Do you have questions or concerns about your Care? No.  Actions: * If pain score is 4 or above: No action needed, pain <4.

## 2017-08-31 ENCOUNTER — Encounter: Payer: Self-pay | Admitting: Gastroenterology

## 2018-07-29 DIAGNOSIS — R82998 Other abnormal findings in urine: Secondary | ICD-10-CM | POA: Diagnosis not present

## 2018-07-29 DIAGNOSIS — Z0000 Encounter for general adult medical examination without abnormal findings: Secondary | ICD-10-CM | POA: Diagnosis not present

## 2018-08-05 DIAGNOSIS — D126 Benign neoplasm of colon, unspecified: Secondary | ICD-10-CM | POA: Diagnosis not present

## 2018-08-05 DIAGNOSIS — R030 Elevated blood-pressure reading, without diagnosis of hypertension: Secondary | ICD-10-CM | POA: Diagnosis not present

## 2018-08-05 DIAGNOSIS — Z1389 Encounter for screening for other disorder: Secondary | ICD-10-CM | POA: Diagnosis not present

## 2018-08-05 DIAGNOSIS — Z0000 Encounter for general adult medical examination without abnormal findings: Secondary | ICD-10-CM | POA: Diagnosis not present

## 2018-08-05 DIAGNOSIS — Z23 Encounter for immunization: Secondary | ICD-10-CM | POA: Diagnosis not present

## 2018-08-05 DIAGNOSIS — Z8249 Family history of ischemic heart disease and other diseases of the circulatory system: Secondary | ICD-10-CM | POA: Diagnosis not present

## 2020-09-28 ENCOUNTER — Other Ambulatory Visit: Payer: Self-pay | Admitting: Internal Medicine

## 2020-09-28 DIAGNOSIS — E785 Hyperlipidemia, unspecified: Secondary | ICD-10-CM

## 2020-10-31 ENCOUNTER — Encounter: Payer: Self-pay | Admitting: Gastroenterology

## 2020-11-15 ENCOUNTER — Other Ambulatory Visit: Payer: Self-pay

## 2020-11-15 ENCOUNTER — Ambulatory Visit (INDEPENDENT_AMBULATORY_CARE_PROVIDER_SITE_OTHER): Payer: 59 | Admitting: Gastroenterology

## 2020-11-15 ENCOUNTER — Encounter: Payer: Self-pay | Admitting: Gastroenterology

## 2020-11-15 ENCOUNTER — Other Ambulatory Visit: Payer: Self-pay | Admitting: Gastroenterology

## 2020-11-15 VITALS — BP 120/80 | HR 104 | Ht 66.0 in | Wt 215.2 lb

## 2020-11-15 DIAGNOSIS — R0789 Other chest pain: Secondary | ICD-10-CM

## 2020-11-15 DIAGNOSIS — K219 Gastro-esophageal reflux disease without esophagitis: Secondary | ICD-10-CM | POA: Diagnosis not present

## 2020-11-15 DIAGNOSIS — Z86010 Personal history of colonic polyps: Secondary | ICD-10-CM

## 2020-11-15 DIAGNOSIS — R112 Nausea with vomiting, unspecified: Secondary | ICD-10-CM

## 2020-11-15 MED ORDER — NA SULFATE-K SULFATE-MG SULF 17.5-3.13-1.6 GM/177ML PO SOLN: 1.0000 | Freq: Once | ORAL | 0 refills | Status: AC

## 2020-11-15 MED ORDER — DEXLANSOPRAZOLE 60 MG PO CPDR: 60.0000 mg | DELAYED_RELEASE_CAPSULE | Freq: Every day | ORAL | 5 refills | Status: DC

## 2020-11-15 NOTE — Progress Notes (Signed)
11/15/2020 Tammy Herman [id number] 02/21/1966   HISTORY OF PRESENT ILLNESS: This is a 55 year old female who is a patient of Dr. Doyne Keel, known to him only for colonoscopy.  She presents here today with complaints of acid reflux.  She says that she has been having symptoms for about the past 6 months or so.  She gets episodes of vomiting as well.  She feels like there is something sitting in her esophagus.  She has seen some blood with episodes of vomiting as well.  She says that sometimes it is so forceful that she has had nosebleeds.  She reports acid reflux/regurgitation when lying down.  She complains of epigastric burning and sharp pain in her epigastrium with vomiting episodes.  Symptoms are not present every day and seem to be random.  She says sometimes it will occur first thing in the morning.  She is on pantoprazole 40 mg daily and has been on that for years.  She had an increased that to twice a day as well.  She takes meloxicam usually a couple of times per week.  She has been taking Tums as needed also.  Colonoscopy December 2018 showed diverticulosis and 3 polyps that were removed, the largest was 7 mm in size.  One was a tubular adenoma and 2 were sessile serrated polyps.  It was recommended that she have a repeat colonoscopy in 3 years.  Referred here on this occasion by Dr. Dagmar Hait, for evaluation of GERD.    Past Medical History:  Diagnosis Date  . Amenorrhea   . Arthritis   . Depression   . GERD (gastroesophageal reflux disease)   . Hyperlipidemia    Past Surgical History:  Procedure Laterality Date  . CARPAL TUNNEL RELEASE Left   . KNEE ARTHROSCOPY WITH LATERAL MENISECTOMY Right 10/18/2014   Procedure: RIGHT KNEE ARTHROSCOPY WITH LATERAL MENISECTOMY;  Surgeon: Mauri Pole, MD;  Location: [facility];  Service: Orthopedics;  Laterality: Right;  . KNEE SURGERY Left 08/2012  . PARTIAL KNEE ARTHROPLASTY Right 10/18/2014   Procedure: RIGHT MEDIAL COMPARTMENTAL  UNI-KNEE ARTHROPLASTY;  Surgeon: Mauri Pole, MD;  Location: [facility];  Service: Orthopedics;  Laterality: Right;  . TOTAL KNEE ARTHROPLASTY Left 06/20/2015   Procedure: TOTAL LEFT KNEE ARTHROPLASTY;  Surgeon: Paralee Cancel, MD;  Location: [facility];  Service: Orthopedics;  Laterality: Left;  . TUBAL LIGATION  2001    reports that she quit smoking about 14 years ago. She has never used smokeless tobacco. She reports current alcohol use of about 7.0 standard drinks of alcohol per week. She reports that she does not use drugs. family history includes Breast cancer in her maternal aunt; Diabetes in her father and sister; Hypertension in her mother. No Known Allergies    Outpatient Encounter Medications as of 11/15/2020  Medication Sig  . atorvastatin (LIPITOR) 40 MG tablet Take 40 mg by mouth daily.  . meloxicam (MOBIC) 15 MG tablet Take 1 tablet (15 mg total) by mouth daily.  . pantoprazole (PROTONIX) 40 MG tablet Take 1 tablet daily by mouth.  . traMADol (ULTRAM) 50 MG tablet Take 1 tablet daily by mouth.  . traZODone (DESYREL) 100 MG tablet Take 100 mg by mouth at bedtime.  Marland Kitchen venlafaxine (EFFEXOR) 75 MG tablet Take 75-150 mg by mouth 2 (two) times daily. Takes 150mg  in the morning and then 75mg  at night   No facility-administered encounter medications on file as of 11/15/2020.     REVIEW OF SYSTEMS  :  All other systems reviewed and negative except where noted in the History of Present Illness.   PHYSICAL EXAM: BP 120/80   Pulse (!) 104   Ht 5\' 6"  (1.676 m)   Wt 215 lb 3.2 oz (97.6 kg)   LMP 09/12/2014   BMI 34.73 kg/m  General: Well developed white female in no acute distress Head: Normocephalic and atraumatic Eyes:  Sclerae anicteric, conjunctiva pink. Ears: Normal auditory acuity Lungs: Clear throughout to auscultation; no W/R/R. Heart: Regular rate and rhythm; no M/R/G. Abdomen: Soft, non-distended.  BS present.  Non-tender. Rectal:  Will be done at the time of  colonoscopy. Musculoskeletal: Symmetrical with no gross deformities  Skin: No lesions on visible extremities Extremities: No edema  Neurological: Alert oriented x 4, grossly non-focal Psychological:  Alert and cooperative. Normal mood and affect  ASSESSMENT AND PLAN: *GERD, atypical chest pain, epigastric abdominal pain, nausea/vomiting: We will plan for EGD with Dr. Havery Moros.  I am going to change her PPI to Dexilant 60 mg daily if she is able to obtain that.  Prescription sent to her pharmacy *Personal history of colon polyps with 1 tubular adenoma and 2 sessile serrated polyps removed in December 2018.  3-year recall was recommended.  We will schedule colonoscopy with Dr. Havery Moros.   CC:  Prince Solian, MD

## 2020-11-15 NOTE — Patient Instructions (Signed)
If you are age 55 or older, your body mass index should be between 23-30. Your Body mass index is 34.73 kg/m. If this is out of the aforementioned range listed, please consider follow up with your Primary Care Provider.  If you are age 33 or younger, your body mass index should be between 19-25. Your Body mass index is 34.73 kg/m. If this is out of the aformentioned range listed, please consider follow up with your Primary Care Provider.   We have sent the following medications to your pharmacy for you to pick up at your convenience: Dexilant 60 mg daily.  You have been scheduled for an endoscopy and colonoscopy. Please follow the written instructions given to you at your visit today. Please pick up your prep supplies at the pharmacy within the next 1-3 days. If you use inhalers (even only as needed), please bring them with you on the day of your procedure.  Due to recent changes in healthcare laws, you may see the results of your imaging and laboratory studies on MyChart before your provider has had a chance to review them.  We understand that in some cases there may be results that are confusing or concerning to you. Not all laboratory results come back in the same time frame and the provider may be waiting for multiple results in order to interpret others.  Please give [place] 48 hours in order for your provider to thoroughly review all the results before contacting the office for clarification of your results.

## 2020-11-30 ENCOUNTER — Encounter: Payer: Self-pay | Admitting: Gastroenterology

## 2020-11-30 DIAGNOSIS — R112 Nausea with vomiting, unspecified: Secondary | ICD-10-CM | POA: Insufficient documentation

## 2020-11-30 DIAGNOSIS — R0789 Other chest pain: Secondary | ICD-10-CM | POA: Insufficient documentation

## 2020-11-30 DIAGNOSIS — Z86010 Personal history of colonic polyps: Secondary | ICD-10-CM | POA: Insufficient documentation

## 2020-11-30 DIAGNOSIS — K219 Gastro-esophageal reflux disease without esophagitis: Secondary | ICD-10-CM | POA: Insufficient documentation

## 2020-12-01 NOTE — Progress Notes (Signed)
Agree with assessment and plan as outlined.  

## 2020-12-02 ENCOUNTER — Encounter: Payer: Self-pay | Admitting: Gastroenterology

## 2021-01-09 ENCOUNTER — Encounter: Payer: Self-pay | Admitting: Gastroenterology

## 2021-01-13 ENCOUNTER — Other Ambulatory Visit: Payer: Self-pay

## 2021-01-13 ENCOUNTER — Encounter: Payer: Self-pay | Admitting: Gastroenterology

## 2021-01-13 ENCOUNTER — Ambulatory Visit (AMBULATORY_SURGERY_CENTER): Payer: 59 | Admitting: Gastroenterology

## 2021-01-13 ENCOUNTER — Telehealth: Payer: Self-pay

## 2021-01-13 VITALS — BP 127/87 | HR 79 | Temp 97.8°F | Resp 12 | Ht 66.0 in | Wt 215.0 lb

## 2021-01-13 DIAGNOSIS — Z86010 Personal history of colonic polyps: Secondary | ICD-10-CM

## 2021-01-13 DIAGNOSIS — K449 Diaphragmatic hernia without obstruction or gangrene: Secondary | ICD-10-CM

## 2021-01-13 DIAGNOSIS — R112 Nausea with vomiting, unspecified: Secondary | ICD-10-CM

## 2021-01-13 DIAGNOSIS — K222 Esophageal obstruction: Secondary | ICD-10-CM

## 2021-01-13 DIAGNOSIS — D123 Benign neoplasm of transverse colon: Secondary | ICD-10-CM | POA: Diagnosis not present

## 2021-01-13 DIAGNOSIS — K311 Adult hypertrophic pyloric stenosis: Secondary | ICD-10-CM

## 2021-01-13 DIAGNOSIS — K219 Gastro-esophageal reflux disease without esophagitis: Secondary | ICD-10-CM | POA: Diagnosis not present

## 2021-01-13 DIAGNOSIS — K319 Disease of stomach and duodenum, unspecified: Secondary | ICD-10-CM

## 2021-01-13 MED ORDER — SODIUM CHLORIDE 0.9 % IV SOLN: 500.0000 mL | Freq: Once | INTRAVENOUS | Status: DC

## 2021-01-13 NOTE — Op Note (Signed)
[facility] Patient Name: Tammy Herman Procedure Date: 01/13/2021 2:27 PM MRN: [id number] Endoscopist: Remo Lipps P. Havery Moros , MD Age: 55 Referring MD:  Date of Birth: May 29, 1966 Gender: Female Account #: [id number] Procedure:                Colonoscopy Indications:              High risk colon cancer surveillance: Personal                            history of colonic polyps (2018 - 1 TA and 2 SSP) Medicines:                Monitored Anesthesia Care Procedure:                Pre-Anesthesia Assessment:                           - Prior to the procedure, a History and Physical                            was performed, and patient medications and                            allergies were reviewed. The patient's tolerance of                            previous anesthesia was also reviewed. The risks                            and benefits of the procedure and the sedation                            options and risks were discussed with the patient.                            All questions were answered, and informed consent                            was obtained. Prior Anticoagulants: The patient has                            taken no previous anticoagulant or antiplatelet                            agents. ASA Grade Assessment: II - A patient with                            mild systemic disease. After reviewing the risks                            and benefits, the patient was deemed in                            satisfactory condition to undergo the procedure.  After obtaining informed consent, the colonoscope                            was passed under direct vision. Throughout the                            procedure, the patient's blood pressure, pulse, and                            oxygen saturations were monitored continuously. The                            Olympus PCF-H190DL ([id number]) Colonoscope was                            introduced  through the anus and advanced to the the                            cecum, identified by appendiceal orifice and                            ileocecal valve. The colonoscopy was performed                            without difficulty. The patient tolerated the                            procedure well. The quality of the bowel                            preparation was good. The ileocecal valve,                            appendiceal orifice, and rectum were photographed. Scope In: 2:55:38 PM Scope Out: 3:18:48 PM Scope Withdrawal Time: 0 hours 14 minutes 59 seconds  Total Procedure Duration: 0 hours 23 minutes 10 seconds  Findings:                 The perianal and digital rectal examinations were                            normal.                           The colon revealed excessive looping, abdominal                            pressure used to achieve cecal intubation.                           A 4 mm polyp was found in the transverse colon. The                            polyp was sessile. The polyp was removed with a  cold snare. Resection and retrieval were complete.                           Scattered small-mouthed diverticula were found in                            the transverse colon and left colon.                           Internal hemorrhoids were found during                            retroflexion. The hemorrhoids were small.                           The exam was otherwise without abnormality. Complications:            No immediate complications. Estimated blood loss:                            Minimal. Estimated Blood Loss:     Estimated blood loss was minimal. Impression:               - There was significant looping of the colon.                           - One 4 mm polyp in the transverse colon, removed                            with a cold snare. Resected and retrieved.                           - Diverticulosis in the transverse colon and  in the                            left colon.                           - Internal hemorrhoids.                           - The examination was otherwise normal. Recommendation:           - Patient has a contact number available for                            emergencies. The signs and symptoms of potential                            delayed complications were discussed with the                            patient. Return to normal activities tomorrow.                            Written discharge instructions were provided to the  patient.                           - Resume previous diet.                           - Continue present medications.                           - Await pathology results. Remo Lipps P. Havery Moros, MD 01/13/2021 3:23:48 PM This report has been signed electronically.

## 2021-01-13 NOTE — Progress Notes (Signed)
VS by CW  Pt's states no medical or surgical changes since previsit or office visit.  

## 2021-01-13 NOTE — Patient Instructions (Addendum)
Handouts were given to your care partner on polyps, diverticulosis, and hemorrhoids. Increase PROTONIX to twice daily. NO NSAIDS (IBUPROFEN, ADVIL, ALEVE, MOBIC, AND MOTRIN); TYLENOL IS OK TO TAKE if needed. You may resume your other current medications today. The 3 floor GI office nurse will call you to schedule a CT scan of abdomen and pelvis with contrast.  Two bottles of contrast was given to your care partner and contrast instructions to be filled in when appointment is given to you. Await biopsy results.  May take 1-3 weeks to receive pathology results. SOFT DIET ONLY!!! Please call if any questions or concerns.     YOU HAD AN ENDOSCOPIC PROCEDURE TODAY AT [facility]:   Refer to the procedure report that was given to you for any specific questions about what was found during the examination.  If the procedure report does not answer your questions, please call your gastroenterologist to clarify.  If you requested that your care partner not be given the details of your procedure findings, then the procedure report has been included in a sealed envelope for you to review at your convenience later.  YOU SHOULD EXPECT: Some feelings of bloating in the abdomen. Passage of more gas than usual.  Walking can help get rid of the air that was put into your GI tract during the procedure and reduce the bloating. If you had a lower endoscopy (such as a colonoscopy or flexible sigmoidoscopy) you may notice spotting of blood in your stool or on the toilet paper. If you underwent a bowel prep for your procedure, you may not have a normal bowel movement for a few days.  Please Note:  You might notice some irritation and congestion in your nose or some drainage.  This is from the oxygen used during your procedure.  There is no need for concern and it should clear up in a day or so.  SYMPTOMS TO REPORT IMMEDIATELY:   Following lower endoscopy (colonoscopy or flexible  sigmoidoscopy):  Excessive amounts of blood in the stool  Significant tenderness or worsening of abdominal pains  Swelling of the abdomen that is new, acute  Fever of 100F or higher   Following upper endoscopy (EGD)  Vomiting of blood or coffee ground material  New chest pain or pain under the shoulder blades  Painful or persistently difficult swallowing  New shortness of breath  Fever of 100F or higher  Black, tarry-looking stools  For urgent or emergent issues, a gastroenterologist can be reached at any hour by calling [phone]. Do not use MyChart messaging for urgent concerns.    DIET:  SOFT DIET ONLY!!!  Drink plenty of fluids but you should avoid alcoholic beverages for 24 hours.  ACTIVITY:  You should plan to take it easy for the rest of today and you should NOT DRIVE or use heavy machinery until tomorrow (because of the sedation medicines used during the test).    FOLLOW UP: Our staff will call the number listed on your records 48-72 hours following your procedure to check on you and address any questions or concerns that you may have regarding the information given to you following your procedure. If we do not reach you, we will leave a message.  We will attempt to reach you two times.  During this call, we will ask if you have developed any symptoms of COVID 19. If you develop any symptoms (ie: fever, flu-like symptoms, shortness of breath, cough etc.) before then, please call [phone].  If you test positive for Covid 19 in the 2 weeks post procedure, please call and report this information to [place].    If any biopsies were taken you will be contacted by phone or by letter within the next 1-3 weeks.  Please call us at [phone] if you have not heard about the biopsies in 3 weeks.    SIGNATURES/CONFIDENTIALITY: You and/or your care partner have signed paperwork which will be entered into your electronic medical record.  These signatures attest to the fact that  that the information above on your After Visit Summary has been reviewed and is understood.  Full responsibility of the confidentiality of this discharge information lies with you and/or your care-partner.

## 2021-01-13 NOTE — Telephone Encounter (Signed)
Patient has been scheduled for a CT scan at [facility] on Monday, 01/23/21 at 2:30 PM, arrive at 2:15 PM. NPO 4 hours prior. Drink 1st bottle at 12:30 PM and 2nd bottle at 1:30 PM.  Patient has been advised of appt but this day does not work for her. She has been provided with the radiology scheduling number to re-schedule at her convenience. She has been given the 2 bottles of contrast. Patient had no further concerns at the end of the call.

## 2021-01-13 NOTE — Telephone Encounter (Signed)
-----   Message from Yetta Flock, MD sent at 01/13/2021  4:26 PM EDT ----- Herbert Seta can you please contact this patient to order a CT scan abdomen / pelvis with contrast to evaluate partial gastric outlet obstruction, pyloric stricture. Thanks

## 2021-01-13 NOTE — Progress Notes (Signed)
A/ox3, pleased with MAC, report to RN 

## 2021-01-13 NOTE — Progress Notes (Signed)
Pt's husband was given 2 bots of contrast and contrast instructions to be filled out when the office calls you with this CT of abdomen and plevis with contrast appointment.  Pt and her husband were informed to hold NSAID and only a SOFT DIET.  Pt instructed to increase PROTONIX to BID.  No problems noted in the recovery room. maw

## 2021-01-13 NOTE — Op Note (Signed)
[facility] Patient Name: Tammy Herman Procedure Date: 01/13/2021 2:31 PM MRN: [id number] Endoscopist: Remo Lipps P. Havery Moros , MD Age: 55 Referring MD:  Date of Birth: 06/04/1966 Gender: Female Account #: [id number] Procedure:                Upper GI endoscopy Indications:              Follow-up of gastro-esophageal reflux disease,                            Nausea with vomiting - on protonix 40mg  / day                            (prescribed Dexilant but not covered by insurance) Medicines:                Monitored Anesthesia Care Procedure:                Pre-Anesthesia Assessment:                           - Prior to the procedure, a History and Physical                            was performed, and patient medications and                            allergies were reviewed. The patient's tolerance of                            previous anesthesia was also reviewed. The risks                            and benefits of the procedure and the sedation                            options and risks were discussed with the patient.                            All questions were answered, and informed consent                            was obtained. Prior Anticoagulants: The patient has                            taken no previous anticoagulant or antiplatelet                            agents. ASA Grade Assessment: II - A patient with                            mild systemic disease. After reviewing the risks                            and benefits, the patient was deemed in  satisfactory condition to undergo the procedure.                           After obtaining informed consent, the endoscope was                            passed under direct vision. Throughout the                            procedure, the patient's blood pressure, pulse, and                            oxygen saturations were monitored continuously. The                             Endoscope was introduced through the mouth, and                            advanced to the pylorus. The upper GI endoscopy was                            accomplished without difficulty. The patient                            tolerated the procedure well. Scope In: Scope Out: Findings:                 Esophagogastric landmarks were identified: the                            Z-line was found at 37 cm, the gastroesophageal                            junction was found at 37 cm and the upper extent of                            the gastric folds was found at 38 cm from the                            incisors.                           A 1 cm hiatal hernia was present.                           The exam of the esophagus was otherwise normal.                           A benign-appearing, intrinsic severe stenosis was                            found at the pylorus, with fibrotic changes. This                            was not  able to be traversed with standard upper                            endoscope. I could visualize what appeared to be a                            normal duodenum distally, however, as the stricture                            appeared short and localized to the pyloric                            channel. A TTS dilator was passed through the                            scope. The distal balloon tip only was placed into                            the duodenal bulb. Dilation with an 8.5 mm pyloric                            balloon dilator was performed, after which                            appropriate mucosal wrents were noted. However                            could still not traverse the stricture with                            standard endoscope. There was edema at the site                            post dilation, further attempts not made for                            dilation pending further imaging. Biopsies were                            taken with a cold  forceps for histology.                           The exam of the stomach was otherwise normal.                           Biopsies were taken with a cold forceps in the                            gastric body, at the incisura and in the gastric                            antrum for Helicobacter pylori testing. Complications:  No immediate complications. Estimated blood loss:                            Minimal. Estimated Blood Loss:     Estimated blood loss was minimal. Impression:               - Esophagogastric landmarks identified.                           - 1 cm hiatal hernia.                           - Normal esophagus                           - Gastric stenosis was found at the pylorus.                            Dilated to 8.1mm as above and biopsied but not able                            to be traversed.                           - Biopsies were taken with a cold forceps for                            Helicobacter pylori testing.                           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 Recommend CT scan to further evaluate,                            and pending results, further dilation at the                            hospital. Recommendation:           - Patient has a contact number available for                            emergencies. The signs and symptoms of potential                            delayed complications were discussed with the                            patient. Return to normal activities tomorrow.                            Written  discharge instructions were provided to the                            patient.                           - Soft diet only                           - Continue present medications.                           - Increase protonix to twice daily                           - Avoid NSAIDs                            - Await pathology results.                           - CT scan abdomen pelvis with contrast as above Remo Lipps P. Havery Moros, MD 01/13/2021 3:31:48 PM This report has been signed electronically.

## 2021-01-13 NOTE — Progress Notes (Signed)
Called to room to assist during endoscopic procedure.  Patient ID and intended procedure confirmed with present staff. Received instructions for my participation in the procedure from the performing physician.  

## 2021-01-17 ENCOUNTER — Telehealth: Payer: Self-pay

## 2021-01-17 ENCOUNTER — Other Ambulatory Visit: Payer: Self-pay | Admitting: Plastic Surgery

## 2021-01-17 NOTE — Telephone Encounter (Signed)
  Follow up Call-  Call back number 01/13/2021  Post procedure Call Back phone  # [phone]  Permission to leave phone message Yes  Some recent data might be hidden     Patient questions:  Do you have a fever, pain , or abdominal swelling? No. Pain Score  0 *  Have you tolerated food without any problems? Yes.    Have you been able to return to your normal activities? Yes.    Do you have any questions about your discharge instructions: Diet   No. Medications  No. Follow up visit  No.  Do you have questions or concerns about your Care? No.  Actions: * If pain score is 4 or above: No action needed, pain <4.  1. Have you developed a fever since your procedure? no  2.   Have you had an respiratory symptoms (SOB or cough) since your procedure? no  3.   Have you tested positive for COVID 19 since your procedure no  4.   Have you had any family members/close contacts diagnosed with the COVID 19 since your procedure?  no   If yes to any of these questions please route to Joylene John, RN and Joella Prince, RN

## 2021-01-23 ENCOUNTER — Ambulatory Visit (HOSPITAL_COMMUNITY): Payer: 59

## 2021-02-15 ENCOUNTER — Other Ambulatory Visit: Payer: Self-pay

## 2021-02-15 ENCOUNTER — Ambulatory Visit (HOSPITAL_COMMUNITY)
Admission: RE | Admit: 2021-02-15 | Discharge: 2021-02-15 | Disposition: A | Discharge status: Home / Self Care | Payer: 59 | Source: Ambulatory Visit | Attending: Gastroenterology | Admitting: Gastroenterology

## 2021-02-15 DIAGNOSIS — K311 Adult hypertrophic pyloric stenosis: Secondary | ICD-10-CM | POA: Diagnosis present

## 2021-02-17 ENCOUNTER — Other Ambulatory Visit: Payer: Self-pay

## 2021-02-17 DIAGNOSIS — K311 Adult hypertrophic pyloric stenosis: Secondary | ICD-10-CM

## 2021-02-17 NOTE — Progress Notes (Signed)
EGD with dilation at [facility] on 9-19 for pyloric stricture.  Start time 7:30am.

## 2021-02-21 ENCOUNTER — Other Ambulatory Visit: Payer: Self-pay | Admitting: Orthopaedic Surgery

## 2021-02-21 ENCOUNTER — Other Ambulatory Visit: Payer: Self-pay

## 2021-02-21 ENCOUNTER — Ambulatory Visit
Admission: RE | Admit: 2021-02-21 | Discharge: 2021-02-21 | Disposition: A | Discharge status: Home / Self Care | Payer: 59 | Source: Ambulatory Visit | Attending: Orthopaedic Surgery | Admitting: Orthopaedic Surgery

## 2021-02-21 DIAGNOSIS — R52 Pain, unspecified: Secondary | ICD-10-CM

## 2021-02-24 ENCOUNTER — Encounter (HOSPITAL_COMMUNITY): Payer: Self-pay | Admitting: Orthopedic Surgery

## 2021-02-24 ENCOUNTER — Other Ambulatory Visit: Payer: Self-pay

## 2021-02-24 NOTE — Progress Notes (Addendum)
COVID Vaccine Completed: Yes Date COVID Vaccine completed: X2 Has received booster: No COVID vaccine manufacturer: Pfizer      Date of COVID positive in last 90 days: No  PCP - Dr Jodi Geralds Medical Cardiologist - N/A  Chest x-ray - N/A EKG - 09/2020 Dr Danna Hefty office Stress Test - N/A ECHO - N/A Cardiac Cath - N/A Pacemaker/ICD device last checked:N/A  Sleep Study - N/A CPAP - N/A  Fasting Blood Sugar - N/A Checks Blood Sugar _N/A____ times a day  Blood Thinner Instructions:N/A Aspirin Instructions:N/A Last Dose:N/A  Activity level:  Able to exercise without symptoms     Anesthesia review: N/A  Patient denies shortness of breath, fever, cough and chest pain at PAT appointment   Patient verbalized understanding of instructions that were given to them at the PAT appointment. Patient was also instructed that they will need to review over the PAT instructions again at home before surgery.

## 2021-02-28 ENCOUNTER — Ambulatory Visit (HOSPITAL_COMMUNITY)
Admission: RE | Admit: 2021-02-28 | Discharge: 2021-02-28 | Disposition: A | Discharge status: Home / Self Care | Payer: 59 | Attending: Orthopedic Surgery | Admitting: Orthopedic Surgery

## 2021-02-28 ENCOUNTER — Ambulatory Visit (HOSPITAL_COMMUNITY): Payer: 59 | Admitting: Anesthesiology

## 2021-02-28 ENCOUNTER — Encounter (HOSPITAL_COMMUNITY): Payer: Self-pay | Admitting: Orthopedic Surgery

## 2021-02-28 ENCOUNTER — Ambulatory Visit (HOSPITAL_COMMUNITY): Payer: 59

## 2021-02-28 ENCOUNTER — Encounter (HOSPITAL_COMMUNITY)
Admission: RE | Disposition: A | Discharge status: Home / Self Care | Payer: Self-pay | Source: Home / Self Care | Attending: Orthopedic Surgery

## 2021-02-28 ENCOUNTER — Other Ambulatory Visit: Payer: Self-pay

## 2021-02-28 DIAGNOSIS — Z87891 Personal history of nicotine dependence: Secondary | ICD-10-CM | POA: Diagnosis not present

## 2021-02-28 DIAGNOSIS — X58XXXA Exposure to other specified factors, initial encounter: Secondary | ICD-10-CM | POA: Diagnosis not present

## 2021-02-28 DIAGNOSIS — S42225A 2-part nondisplaced fracture of surgical neck of left humerus, initial encounter for closed fracture: Secondary | ICD-10-CM | POA: Diagnosis not present

## 2021-02-28 DIAGNOSIS — S42252A Displaced fracture of greater tuberosity of left humerus, initial encounter for closed fracture: Secondary | ICD-10-CM | POA: Diagnosis not present

## 2021-02-28 DIAGNOSIS — Z79899 Other long term (current) drug therapy: Secondary | ICD-10-CM | POA: Insufficient documentation

## 2021-02-28 DIAGNOSIS — S42265A Nondisplaced fracture of lesser tuberosity of left humerus, initial encounter for closed fracture: Secondary | ICD-10-CM | POA: Diagnosis not present

## 2021-02-28 DIAGNOSIS — S42309A Unspecified fracture of shaft of humerus, unspecified arm, initial encounter for closed fracture: Secondary | ICD-10-CM

## 2021-02-28 HISTORY — DX: Other specified postprocedural states: R11.2

## 2021-02-28 HISTORY — PX: ORIF HUMERUS FRACTURE: SHX2126

## 2021-02-28 HISTORY — DX: Other specified postprocedural states: Z98.890

## 2021-02-28 SURGERY — OPEN REDUCTION INTERNAL FIXATION (ORIF) PROXIMAL HUMERUS FRACTURE
Anesthesia: General | Site: Shoulder | Laterality: Left

## 2021-02-28 MED ORDER — BUPIVACAINE LIPOSOME 1.3 % IJ SUSP
INTRAMUSCULAR | Status: DC | PRN
  Administered 2021-02-28 (×5): 2 mL via PERINEURAL

## 2021-02-28 MED ORDER — OXYCODONE HCL 5 MG/5ML PO SOLN: 5.0000 mg | Freq: Once | ORAL | Status: AC | PRN

## 2021-02-28 MED ORDER — OXYCODONE HCL 5 MG PO TABS
5.0000 mg | ORAL_TABLET | Freq: Once | ORAL | Status: AC | PRN
  Administered 2021-02-28: 5 mg via ORAL

## 2021-02-28 MED ORDER — OXYCODONE HCL 5 MG PO TABS
ORAL_TABLET | ORAL | Status: AC
  Filled 2021-02-28: qty 1

## 2021-02-28 MED ORDER — ACETAMINOPHEN 325 MG PO TABS: 325.0000 mg | ORAL_TABLET | ORAL | Status: DC | PRN

## 2021-02-28 MED ORDER — SCOPOLAMINE 1 MG/3DAYS TD PT72
MEDICATED_PATCH | TRANSDERMAL | Status: AC
  Filled 2021-02-28: qty 1

## 2021-02-28 MED ORDER — VANCOMYCIN HCL 1000 MG IV SOLR
INTRAVENOUS | Status: AC
  Filled 2021-02-28: qty 1000

## 2021-02-28 MED ORDER — ESMOLOL HCL 100 MG/10ML IV SOLN
INTRAVENOUS | Status: AC
  Filled 2021-02-28: qty 10

## 2021-02-28 MED ORDER — DEXAMETHASONE SODIUM PHOSPHATE 10 MG/ML IJ SOLN
INTRAMUSCULAR | Status: DC | PRN
  Administered 2021-02-28: 4 mg via INTRAVENOUS

## 2021-02-28 MED ORDER — KETOROLAC TROMETHAMINE 30 MG/ML IJ SOLN
30.0000 mg | Freq: Once | INTRAMUSCULAR | Status: AC | PRN
  Administered 2021-02-28: 30 mg via INTRAVENOUS

## 2021-02-28 MED ORDER — OXYCODONE HCL 5 MG PO TABS: 5.0000 mg | ORAL_TABLET | ORAL | 0 refills | Status: AC | PRN

## 2021-02-28 MED ORDER — ONDANSETRON HCL 4 MG/2ML IJ SOLN
INTRAMUSCULAR | Status: AC
  Filled 2021-02-28: qty 2

## 2021-02-28 MED ORDER — MEPERIDINE HCL 50 MG/ML IJ SOLN: 6.2500 mg | INTRAMUSCULAR | Status: DC | PRN

## 2021-02-28 MED ORDER — ONDANSETRON HCL 4 MG/2ML IJ SOLN: 4.0000 mg | Freq: Once | INTRAMUSCULAR | Status: DC | PRN

## 2021-02-28 MED ORDER — METHOCARBAMOL 500 MG PO TABS: 500.0000 mg | ORAL_TABLET | Freq: Four times a day (QID) | ORAL | 1 refills | Status: AC | PRN

## 2021-02-28 MED ORDER — VANCOMYCIN HCL 1000 MG IV SOLR
INTRAVENOUS | Status: DC | PRN
  Administered 2021-02-28: 1000 mg via TOPICAL

## 2021-02-28 MED ORDER — FENTANYL CITRATE (PF) 100 MCG/2ML IJ SOLN
25.0000 ug | INTRAMUSCULAR | Status: DC | PRN
  Administered 2021-02-28 (×2): 25 ug via INTRAVENOUS
  Administered 2021-02-28: 50 ug via INTRAVENOUS

## 2021-02-28 MED ORDER — PROPOFOL 10 MG/ML IV BOLUS
INTRAVENOUS | Status: AC
  Filled 2021-02-28: qty 20

## 2021-02-28 MED ORDER — LACTATED RINGERS IV SOLN: INTRAVENOUS | Status: DC | PRN

## 2021-02-28 MED ORDER — TRANEXAMIC ACID-NACL 1000-0.7 MG/100ML-% IV SOLN
1000.0000 mg | INTRAVENOUS | Status: AC
  Administered 2021-02-28: 1000 mg via INTRAVENOUS
  Filled 2021-02-28: qty 100

## 2021-02-28 MED ORDER — MIDAZOLAM HCL 2 MG/2ML IJ SOLN
1.0000 mg | INTRAMUSCULAR | Status: DC
  Administered 2021-02-28: 2 mg via INTRAVENOUS
  Filled 2021-02-28: qty 2

## 2021-02-28 MED ORDER — PROPOFOL 10 MG/ML IV BOLUS
INTRAVENOUS | Status: DC | PRN
  Administered 2021-02-28: 200 mg via INTRAVENOUS

## 2021-02-28 MED ORDER — DEXAMETHASONE SODIUM PHOSPHATE 10 MG/ML IJ SOLN
INTRAMUSCULAR | Status: AC
  Filled 2021-02-28: qty 1

## 2021-02-28 MED ORDER — ROCURONIUM BROMIDE 10 MG/ML (PF) SYRINGE
PREFILLED_SYRINGE | INTRAVENOUS | Status: AC
  Filled 2021-02-28: qty 10

## 2021-02-28 MED ORDER — PHENYLEPHRINE HCL (PRESSORS) 10 MG/ML IV SOLN
INTRAVENOUS | Status: AC
  Filled 2021-02-28: qty 1

## 2021-02-28 MED ORDER — ONDANSETRON HCL 4 MG/2ML IJ SOLN
INTRAMUSCULAR | Status: DC | PRN
  Administered 2021-02-28: 4 mg via INTRAVENOUS

## 2021-02-28 MED ORDER — FENTANYL CITRATE (PF) 100 MCG/2ML IJ SOLN
INTRAMUSCULAR | Status: AC
  Filled 2021-02-28: qty 2

## 2021-02-28 MED ORDER — CEFAZOLIN SODIUM-DEXTROSE 2-4 GM/100ML-% IV SOLN
2.0000 g | INTRAVENOUS | Status: AC
  Administered 2021-02-28: 2 g via INTRAVENOUS
  Filled 2021-02-28: qty 100

## 2021-02-28 MED ORDER — ACETAMINOPHEN 160 MG/5ML PO SOLN: 325.0000 mg | ORAL | Status: DC | PRN

## 2021-02-28 MED ORDER — LIDOCAINE 2% (20 MG/ML) 5 ML SYRINGE
INTRAMUSCULAR | Status: DC | PRN
  Administered 2021-02-28: 100 mg via INTRAVENOUS

## 2021-02-28 MED ORDER — FENTANYL CITRATE (PF) 250 MCG/5ML IJ SOLN
INTRAMUSCULAR | Status: DC | PRN
  Administered 2021-02-28: 100 ug via INTRAVENOUS

## 2021-02-28 MED ORDER — PHENYLEPHRINE HCL-NACL 10-0.9 MG/250ML-% IV SOLN
INTRAVENOUS | Status: DC | PRN
  Administered 2021-02-28: 40 ug/min via INTRAVENOUS

## 2021-02-28 MED ORDER — BUPIVACAINE-EPINEPHRINE (PF) 0.5% -1:200000 IJ SOLN
INTRAMUSCULAR | Status: DC | PRN
  Administered 2021-02-28 (×5): 3 mL via PERINEURAL

## 2021-02-28 MED ORDER — KETOROLAC TROMETHAMINE 30 MG/ML IJ SOLN
INTRAMUSCULAR | Status: AC
  Filled 2021-02-28: qty 1

## 2021-02-28 MED ORDER — ROCURONIUM BROMIDE 10 MG/ML (PF) SYRINGE
PREFILLED_SYRINGE | INTRAVENOUS | Status: DC | PRN
  Administered 2021-02-28: 70 mg via INTRAVENOUS

## 2021-02-28 MED ORDER — FENTANYL CITRATE (PF) 100 MCG/2ML IJ SOLN
50.0000 ug | INTRAMUSCULAR | Status: DC
Start: 2021-02-28 — End: 2021-03-01
  Administered 2021-02-28: 100 ug via INTRAVENOUS
  Filled 2021-02-28: qty 2

## 2021-02-28 MED ORDER — 0.9 % SODIUM CHLORIDE (POUR BTL) OPTIME
TOPICAL | Status: DC | PRN
  Administered 2021-02-28: 1000 mL

## 2021-02-28 MED ORDER — SCOPOLAMINE 1 MG/3DAYS TD PT72
MEDICATED_PATCH | TRANSDERMAL | Status: DC | PRN
  Administered 2021-02-28: 1 via TRANSDERMAL

## 2021-02-28 MED ORDER — ONDANSETRON HCL 4 MG PO TABS: 4.0000 mg | ORAL_TABLET | Freq: Every day | ORAL | 1 refills | Status: AC | PRN

## 2021-02-28 SURGICAL SUPPLY — 64 items
BAG ZIPLOCK 12X15 (MISCELLANEOUS) ×3 IMPLANT
BIT DRILL 3.2 (BIT) ×3
BIT DRILL 3.2XCALB NS DISP (BIT) ×1 IMPLANT
BIT DRILL CALIBRATED 2.7 (BIT) ×2 IMPLANT
BIT DRILL CALIBRATED 2.7MM (BIT) ×1
BIT DRL 3.2XCALB NS DISP (BIT) ×1
CHLORAPREP W/TINT 26 (MISCELLANEOUS) ×3 IMPLANT
COVER SURGICAL LIGHT HANDLE (MISCELLANEOUS) ×3 IMPLANT
COVER WAND RF STERILE (DRAPES) IMPLANT
DRAPE C-ARM 42X120 X-RAY (DRAPES) ×3 IMPLANT
DRAPE U-SHAPE 47X51 STRL (DRAPES) ×3 IMPLANT
DRSG AQUACEL AG ADV 3.5X 6 (GAUZE/BANDAGES/DRESSINGS) IMPLANT
DRSG AQUACEL AG ADV 3.5X10 (GAUZE/BANDAGES/DRESSINGS) ×3 IMPLANT
DRSG EMULSION OIL 3X16 NADH (GAUZE/BANDAGES/DRESSINGS) ×3 IMPLANT
DRSG PAD ABDOMINAL 8X10 ST (GAUZE/BANDAGES/DRESSINGS) ×3 IMPLANT
ELECT REM PT RETURN 15FT ADLT (MISCELLANEOUS) ×3 IMPLANT
GLOVE SURG ENC TEXT LTX SZ7 (GLOVE) IMPLANT
GLOVE SURG LTX SZ8 (GLOVE) IMPLANT
GLOVE SURG ORTHO LTX SZ7.5 (GLOVE) ×6 IMPLANT
GLOVE SURG ORTHO LTX SZ8 (GLOVE) ×3 IMPLANT
GLOVE SURG UNDER POLY LF SZ7.5 (GLOVE) ×3 IMPLANT
GLOVE SURG UNDER POLY LF SZ8.5 (GLOVE) ×3 IMPLANT
GOWN STRL REUS W/TWL LRG LVL3 (GOWN DISPOSABLE) ×3 IMPLANT
GOWN STRL REUS W/TWL XL LVL3 (GOWN DISPOSABLE) ×6 IMPLANT
K-WIRE 2X5 SS THRDED S3 (WIRE) ×6
KIT BASIN OR (CUSTOM PROCEDURE TRAY) ×3 IMPLANT
KIT TURNOVER KIT A (KITS) ×3 IMPLANT
KWIRE 2X5 SS THRDED S3 (WIRE) ×2 IMPLANT
MANIFOLD NEPTUNE II (INSTRUMENTS) ×3 IMPLANT
NS IRRIG 1000ML POUR BTL (IV SOLUTION) ×3 IMPLANT
PACK SHOULDER (CUSTOM PROCEDURE TRAY) ×3 IMPLANT
PAD CAST 3X4 CTTN HI CHSV (CAST SUPPLIES) ×1 IMPLANT
PAD CAST 4YDX4 CTTN HI CHSV (CAST SUPPLIES) ×1 IMPLANT
PADDING CAST COTTON 3X4 STRL (CAST SUPPLIES) ×3
PADDING CAST COTTON 4X4 STRL (CAST SUPPLIES) ×3
PEG LOCKING 3.2MMX54 (Peg) ×6 IMPLANT
PEG LOCKING 3.2MMX56 (Peg) ×3 IMPLANT
PEG LOCKING 3.2X32 (Peg) ×3 IMPLANT
PEG LOCKING 3.2X34 (Screw) ×3 IMPLANT
PEG LOCKING 3.2X38 (Screw) ×3 IMPLANT
PEG LOCKING 3.2X42 (Screw) ×6 IMPLANT
PEG LOCKING 3.2X50 (Screw) ×6 IMPLANT
PENCIL SMOKE EVACUATOR (MISCELLANEOUS) ×3 IMPLANT
PLATE PROX HUM HI L 3H 80 (Plate) ×3 IMPLANT
PROTECTOR NERVE ULNAR (MISCELLANEOUS) ×3 IMPLANT
SCREW LOW PROF TIS 3.5X28MM (Screw) ×3 IMPLANT
SCREW LP NL T15 3.5X26 (Screw) ×6 IMPLANT
SLEEVE MEASURING 3.2 (BIT) ×3 IMPLANT
SLING ARM FOAM STRAP LRG (SOFTGOODS) ×3 IMPLANT
STAPLER VISISTAT 35W (STAPLE) ×3 IMPLANT
SUT FIBERWIRE #2 38 REV NDL BL (SUTURE) ×3
SUT FIBERWIRE #2 38 T-5 BLUE (SUTURE) ×3
SUT MAXBRAID #2 CVD NDL (SUTURE) ×3 IMPLANT
SUT MAXBRAID #5 CCS-NDL 2PK (SUTURE) ×3 IMPLANT
SUT MNCRL AB 4-0 PS2 18 (SUTURE) ×3 IMPLANT
SUT VIC AB 0 CT1 27 (SUTURE) ×6
SUT VIC AB 0 CT1 27XBRD ANTBC (SUTURE) ×2 IMPLANT
SUT VIC AB 2-0 CT1 27 (SUTURE) ×3
SUT VIC AB 2-0 CT1 TAPERPNT 27 (SUTURE) ×1 IMPLANT
SUTURE FIBERWR #2 38 T-5 BLUE (SUTURE) ×1 IMPLANT
SUTURE FIBERWR#2 38 REV NDL BL (SUTURE) ×1 IMPLANT
TOWEL OR 17X26 10 PK STRL BLUE (TOWEL DISPOSABLE) ×6 IMPLANT
UNDERPAD 30X36 HEAVY ABSORB (UNDERPADS AND DIAPERS) ×3 IMPLANT
WATER STERILE IRR 1000ML POUR (IV SOLUTION) ×3 IMPLANT

## 2021-02-28 NOTE — Anesthesia Postprocedure Evaluation (Signed)
Anesthesia Post Note  Patient: Tammy Herman  Procedure(s) Performed: OPEN REDUCTION INTERNAL FIXATION (ORIF) PROXIMAL HUMERUS FRACTURE (Left Shoulder)     Patient location during evaluation: PACU Anesthesia Type: General Level of consciousness: awake and sedated Pain management: pain level controlled Vital Signs Assessment: post-procedure vital signs reviewed and stable Respiratory status: spontaneous breathing Cardiovascular status: stable Postop Assessment: no apparent nausea or vomiting Anesthetic complications: no   No complications documented.  Last Vitals:  Vitals:   02/28/21 1700 02/28/21 1915  BP:  134/86  Pulse: 88 83  Resp: 14 16  Temp:  36.9 C  SpO2: 99% 100%    Last Pain:  Vitals:   02/28/21 1915  TempSrc:   PainSc: 0-No pain                 Huston Foley

## 2021-02-28 NOTE — Anesthesia Procedure Notes (Signed)
Procedure Name: Intubation Date/Time: 02/28/2021 5:27 PM Performed by: Eben Burow, CRNA Pre-anesthesia Checklist: Patient identified, Emergency Drugs available, Suction available, Patient being monitored and Timeout performed Patient Re-evaluated:Patient Re-evaluated prior to induction Oxygen Delivery Method: Circle system utilized Preoxygenation: Pre-oxygenation with 100% oxygen Induction Type: IV induction Ventilation: Mask ventilation without difficulty Laryngoscope Size: Mac and 4 Grade View: Grade I Tube type: Oral Tube size: 7.0 mm Number of attempts: 1 Airway Equipment and Method: Stylet Placement Confirmation: ETT inserted through vocal cords under direct vision,  positive ETCO2 and breath sounds checked- equal and bilateral Secured at: 21 cm Tube secured with: Tape Dental Injury: Teeth and Oropharynx as per pre-operative assessment

## 2021-02-28 NOTE — Progress Notes (Signed)
AssistedDr. Hatchett with left, ultrasound guided, interscalene  block. Side rails up, monitors on throughout procedure. See vital signs in flow sheet. Tolerated Procedure well.  

## 2021-02-28 NOTE — Discharge Instructions (Signed)
General Anesthesia, Adult, Care After This sheet gives you information about how to care for yourself after your procedure. Your health care provider may also give you more specific instructions. If you have problems or questions, contact your health care provider. What can I expect after the procedure? After the procedure, the following side effects are common:  Pain or discomfort at the IV site.  Nausea.  Vomiting.  Sore throat.  Trouble concentrating.  Feeling cold or chills.  Feeling weak or tired.  Sleepiness and fatigue.  Soreness and body aches. These side effects can affect parts of the body that were not involved in surgery. Follow these instructions at home: For the time period you were told by your health care provider:  Rest.  Do not participate in activities where you could fall or become injured.  Do not drive or use machinery.  Do not drink alcohol.  Do not take sleeping pills or medicines that cause drowsiness.  Do not make important decisions or sign legal documents.  Do not take care of children on your own.   Eating and drinking  Follow any instructions from your health care provider about eating or drinking restrictions.  When you feel hungry, start by eating small amounts of foods that are soft and easy to digest (bland), such as toast. Gradually return to your regular diet.  Drink enough fluid to keep your urine pale yellow.  If you vomit, rehydrate by drinking water, juice, or clear broth. General instructions  If you have sleep apnea, surgery and certain medicines can increase your risk for breathing problems. Follow instructions from your health care provider about wearing your sleep device: ? Anytime you are sleeping, including during daytime naps. ? While taking prescription pain medicines, sleeping medicines, or medicines that make you drowsy.  Have a responsible adult stay with you for the time you are told. It is important to have  someone help care for you until you are awake and alert.  Return to your normal activities as told by your health care provider. Ask your health care provider what activities are safe for you.  Take over-the-counter and prescription medicines only as told by your health care provider.  If you smoke, do not smoke without supervision.  Keep all follow-up visits as told by your health care provider. This is important. Contact a health care provider if:  You have nausea or vomiting that does not get better with medicine.  You cannot eat or drink without vomiting.  You have pain that does not get better with medicine.  You are unable to pass urine.  You develop a skin rash.  You have a fever.  You have redness around your IV site that gets worse. Get help right away if:  You have difficulty breathing.  You have chest pain.  You have blood in your urine or stool, or you vomit blood. Summary  After the procedure, it is common to have a sore throat or nausea. It is also common to feel tired.  Have a responsible adult stay with you for the time you are told. It is important to have someone help care for you until you are awake and alert.  When you feel hungry, start by eating small amounts of foods that are soft and easy to digest (bland), such as toast. Gradually return to your regular diet.  Drink enough fluid to keep your urine pale yellow.  Return to your normal activities as told by your health care provider.   Ask your health care provider what activities are safe for you. This information is not intended to replace advice given to you by your health care provider. Make sure you discuss any questions you have with your health care provider. Document Revised: 05/26/2020 Document Reviewed: 12/24/2019 Elsevier Patient Education  2021 [place] discharge instructions:  -Maintain postoperative bandage until your follow-up appointment.  If this become saturated  or soiled you should change and replace with daily daily dry dressing.  -You should apply ice to the left shoulder liberally around-the-clock.  Do this for 20 to 30 minutes out of each hour that you are able.  For mild to moderate pain take Tylenol and Advil around-the-clock.  For breakthrough pain use oxycodone as necessary.  -Maintain your arm in the sling at all times unless doing daily pendulum exercises or hand, elbow, and wrist exercises.  No lifting with the left arm.  -You may shower beginning on postoperative day #3.  You can shower with your postoperative bandage in place.  Do not submerge underwater.  -Return to see Dr. Stann Mainland in 2 weeks with postoperative x-rays and wound check.

## 2021-02-28 NOTE — H&P (Signed)
ORTHOPAEDIC H and P  REQUESTING PHYSICIAN: Nicholes Stairs, MD  PCP:  Prince Solian, MD  Chief Complaint: Left proximal humerus fracture  HPI: Tammy Herman is a 55 y.o. female who complains of left shoulder pain and dysfunction.  She sustained an injury to the left shoulder about 10 days ago.  She was evaluated in the urgent care clinic of the office.  She was noted to have the proximal humerus fracture.  She is here today for definitive internal fixation.  No new complaints at this time.  Past Medical History:  Diagnosis Date  . Amenorrhea   . Arthritis   . Depression   . GERD (gastroesophageal reflux disease)   . Hyperlipidemia   . PONV (postoperative nausea and vomiting)    Past Surgical History:  Procedure Laterality Date  . BREAST REDUCTION SURGERY    . CARPAL TUNNEL RELEASE Left   . KNEE ARTHROSCOPY WITH LATERAL MENISECTOMY Right 10/18/2014   Procedure: RIGHT KNEE ARTHROSCOPY WITH LATERAL MENISECTOMY;  Surgeon: Mauri Pole, MD;  Location: [facility];  Service: Orthopedics;  Laterality: Right;  . KNEE SURGERY Left 08/2012  . PARTIAL KNEE ARTHROPLASTY Right 10/18/2014   Procedure: RIGHT MEDIAL COMPARTMENTAL UNI-KNEE ARTHROPLASTY;  Surgeon: Mauri Pole, MD;  Location: [facility];  Service: Orthopedics;  Laterality: Right;  . TOTAL KNEE ARTHROPLASTY Left 06/20/2015   Procedure: TOTAL LEFT KNEE ARTHROPLASTY;  Surgeon: Paralee Cancel, MD;  Location: [facility];  Service: Orthopedics;  Laterality: Left;  . TUBAL LIGATION  2001   Social History   Socioeconomic History  . Marital status: Married    Spouse name: Not on file  . Number of children: Not on file  . Years of education: Not on file  . Highest education level: Not on file  Occupational History  . Not on file  Tobacco Use  . Smoking status: Former Smoker    Quit date: 07/27/2006    Years since quitting: 14.6  . Smokeless tobacco: Never Used  Vaping Use  . Vaping Use: Never used  Substance and Sexual Activity   . Alcohol use: Yes    Alcohol/week: 7.0 standard drinks    Types: 7 Glasses of wine per week    Comment: 1-2 glasses wine nightly per pt  . Drug use: No  . Sexual activity: Yes    Partners: Male    Birth control/protection: Surgical    Comment: Tubal ligation  Other Topics Concern  . Not on file  Social History Narrative  . Not on file   Social Determinants of Health   Financial Resource Strain: Not on file  Food Insecurity: Not on file  Transportation Needs: Not on file  Physical Activity: Not on file  Stress: Not on file  Social Connections: Not on file   Family History  Problem Relation Age of Onset  . Hypertension Mother   . Diabetes Father        Type 2  . Diabetes Sister        Type 2  . Breast cancer Maternal Aunt   . Colon cancer Neg Hx   . Stomach cancer Neg Hx   . Esophageal cancer Neg Hx   . Rectal cancer Neg Hx    No Known Allergies Prior to Admission medications   Medication Sig Start Date End Date Taking? Authorizing Provider  HYDROcodone-acetaminophen (NORCO/VICODIN) 5-325 MG tablet Take 1-2 tablets by mouth every 4 (four) hours as needed for pain. 02/21/21  Yes [provider]  meloxicam (  MOBIC) 15 MG tablet Take 1 tablet (15 mg total) by mouth daily. 07/23/16  Yes Ward, Ozella Almond, PA-C  pantoprazole (PROTONIX) 40 MG tablet Take 40 mg by mouth daily before supper. 12/23/20  Yes [provider]  traZODone (DESYREL) 100 MG tablet Take 200 mg by mouth at bedtime.   Yes [provider]  venlafaxine (EFFEXOR) 75 MG tablet Take 150 mg by mouth daily.   Yes [provider]  dexlansoprazole (DEXILANT) 60 MG capsule Take 1 capsule (60 mg total) by mouth daily. Patient not taking: No sig reported 11/15/20   Zehr, Laban Emperor, PA-C  oxyCODONE-acetaminophen (PERCOCET/ROXICET) 5-325 MG tablet Take by mouth. Patient not taking: No sig reported 01/02/21   [provider]   No results found.  Positive ROS: All other  systems have been reviewed and were otherwise negative with the exception of those mentioned in the HPI and as above.  Physical Exam: General: Alert, no acute distress Cardiovascular: No pedal edema Respiratory: No cyanosis, no use of accessory musculature GI: No organomegaly, abdomen is soft and non-tender Skin: No lesions in the area of chief complaint Neurologic: Sensation intact distally Psychiatric: Patient is competent for consent with normal mood and affect Lymphatic: No axillary or cervical lymphadenopathy  MUSCULOSKELETAL:  Left upper extremity is warm and well-perfused with no open wounds.  Some bruising in the axilla and down the brachium.  Assessment: 1.  Left 2 part proximal humerus fracture with displacement of the greater tuberosity, closed.  Plan: -Plan is to proceed today with open reduction internal fixation.  We discussed the risk of bleeding, infection, damage to surrounding nerves and vessels, hardware pain, periprosthetic fracture, nonunion, malunion, need for revision surgery, and the risk of anesthesia.  She has provided informed consent.  -We will plan for discharge home postoperatively from PACU.    Nicholes Stairs, MD Cell [phone]    02/28/2021 4:30 PM

## 2021-02-28 NOTE — Transfer of Care (Signed)
Immediate Anesthesia Transfer of Care Note  Patient: Tammy Herman  Procedure(s) Performed: OPEN REDUCTION INTERNAL FIXATION (ORIF) PROXIMAL HUMERUS FRACTURE (Left Shoulder)  Patient Location: PACU  Anesthesia Type:GA combined with regional for post-op pain  Level of Consciousness: awake, alert , oriented and patient cooperative  Airway & Oxygen Therapy: Patient Spontanous Breathing and Patient connected to face mask oxygen  Post-op Assessment: Report given to RN and Post -op Vital signs reviewed and stable  Post vital signs: Reviewed and stable  Last Vitals:  Vitals Value Taken Time  BP 134/86 02/28/21 1915  Temp    Pulse 81 02/28/21 1919  Resp 17 02/28/21 1919  SpO2 99 % 02/28/21 1919  Vitals shown include unvalidated device data.  Last Pain:  Vitals:   02/28/21 1700  TempSrc:   PainSc: 0-No pain         Complications: No complications documented.

## 2021-02-28 NOTE — Anesthesia Preprocedure Evaluation (Signed)
Anesthesia Evaluation  Patient identified by MRN, date of birth, ID band Patient awake    Reviewed: Allergy & Precautions, NPO status , Patient's Chart, lab work & pertinent test results  History of Anesthesia Complications (+) PONV and history of anesthetic complications  Airway Mallampati: I  TM Distance: >3 FB Neck ROM: Full    Dental no notable dental hx.    Pulmonary former smoker,    Pulmonary exam normal breath sounds clear to auscultation       Cardiovascular negative cardio ROS Normal cardiovascular exam Rhythm:Regular Rate:Normal     Neuro/Psych PSYCHIATRIC DISORDERS Depression negative neurological ROS     GI/Hepatic Neg liver ROS, GERD  Medicated and Controlled,  Endo/Other  negative endocrine ROS  Renal/GU negative Renal ROS  negative genitourinary   Musculoskeletal   Abdominal (+) + obese,   Peds negative pediatric ROS (+)  Hematology negative hematology ROS (+)   Anesthesia Other Findings   Reproductive/Obstetrics negative OB ROS                             Anesthesia Physical  Anesthesia Plan  ASA: II  Anesthesia Plan: General   Post-op Pain Management:  Regional for Post-op pain   Induction: Intravenous  PONV Risk Score and Plan: 4 or greater and Ondansetron, Dexamethasone, Scopolamine patch - Pre-op and Midazolam  Airway Management Planned: Oral ETT  Additional Equipment: None  Intra-op Plan:   Post-operative Plan: Extubation in OR  Informed Consent: I have reviewed the patients History and Physical, chart, labs and discussed the procedure including the risks, benefits and alternatives for the proposed anesthesia with the patient or authorized representative who has indicated his/her understanding and acceptance.     Dental advisory given  Plan Discussed with: CRNA  Anesthesia Plan Comments:         Anesthesia Quick Evaluation

## 2021-02-28 NOTE — Op Note (Signed)
02/28/2021  6:56 PM  PATIENT:  Tammy Herman    PRE-OPERATIVE DIAGNOSIS:   1. Left proximal humerus fracture, 2 part with displacement of the greater tuberosity, and nondisplaced components of the surgical neck and lesser tuberosity.  POST-OPERATIVE DIAGNOSIS:  Same  PROCEDURE:  OPEN REDUCTION INTERNAL FIXATION (ORIF) PROXIMAL LEFT HUMERUS FRACTURE  SURGEON:  Nicholes Stairs, MD  PHYSICIAN ASSISTANT: Jonelle Sidle, PA-C present and scrubbed throughout the case, critical for completion in a timely fashion, and for retraction, instrumentation, and closure.  ANESTHESIA:   General  PREOPERATIVE INDICATIONS:  Tammy Herman is a  55 y.o. female with a diagnosis of Left proximal humerus fracture who elected for surgical management.    The risks benefits and alternatives were discussed with the patient including but not limited to the risks of nonoperative treatment, versus surgical intervention including infection, bleeding, nerve injury, malunion, nonunion, the need for revision surgery, hardware prominence, hardware failure, the need for hardware removal, blood clots, cardiopulmonary complications, conversion to arthroplasty, morbidity, mortality, among others, and they were willing to proceed.  Predicted outcome is good, although there will be at least a six to nine month expected recovery.   OPERATIVE IMPLANTS: Biomet ALPS proximal humerus locking plate. Multiple #2 max braid sutures for tuberosity fixation  OPERATIVE FINDINGS: Displaced proximal humerus fracture of the greater tuberosity with nondisplaced components of the lesser tuberosity and surgical neck.  OPERATIVE PROCEDURE: The patient was brought to the operating room and placed in the supine position. General anesthesia was administered. IV antibiotics were given. She was placed in the beach chair position. All bony prominences were padded. The upper extremity was prepped and draped in usual sterile fashion. Deltopectoral  incision was performed.  I exposed the fracture site, and placed deep retractors. I did not tenotomize the biceps tendon. This was left in place. I elevated a small portion of the deltoid off of the shaft, in order to gain access for the plate. I placed supraspinatus and subscapularis stitches, and then reduced the head onto the shaft. This was maintained in satisfactory position.  3 separate Mason-Allen sutures were placed in the greater tuberosity tendon bone interface to control the comminuted greater tuberosity fragment.  I applied the plate and secured it into the sliding hole first. I confirmed position of the reduction and the plate with C-arm, and I placed a total of 2 guidewires into the appropriate position in the head. I was satisfied that the plate was distal appropriately, and then secured the plate proximally with smooth pegs, taking care to prevent penetration into the arch articular surface, using C-arm, as well as manual feel using a hand drill.  I then secured the plate distally using another cortical screw. I then passed the FiberWire sutures from the subscapularis and supraspinatus, teres minor, and infraspinatus through the plate and secured the tuberosities. Once complete fixation and reduction of been achieved, took final C-arm pictures, and irrigated the wounds copiously, placed 1 g of vancomycin powder into the deltopectoral interval, and repaired the deltopectoral interval with Vicryl followed by Vicryl for the subcutaneous tissue with staples for the skin. She was placed in a sling. She had a preoperative regional block as well. She tolerated the procedure well with no complications.  All counts were correct x2.  She was transported to PACU in stable condition.  Disposition: Tammy Herman will be allowed for immediate pendulums and scapular retractions, she will be nonweightbearing to the left upper extremity.  However, may do elbow, hand  and wrist range of motion as tolerated.  No  lifting with the left arm.  She will discharge home today from PACU and follow-up with me in 2 weeks.  Should begin physical therapy next week working on post fracture protocol.  Maintain postoperative bandage until follow-up.  2 x-ray views of the left shoulder at follow-up, AP, and scapular Y.

## 2021-02-28 NOTE — Anesthesia Procedure Notes (Signed)
Anesthesia Regional Block: Interscalene brachial plexus block   Pre-Anesthetic Checklist: ,, timeout performed, Correct Patient, Correct Site, Correct Laterality, Correct Procedure, Correct Position, site marked, Risks and benefits discussed,  Surgical consent,  Pre-op evaluation,  At surgeon's request and post-op pain management  Laterality: Left and Upper  Prep: chloraprep       Needles:  Injection technique: Single-shot  Needle Type: Echogenic Stimulator Needle     Needle Length: 9cm  Needle Gauge: 20   Needle insertion depth: 1.5 cm   Additional Needles:   Procedures:,,,, ultrasound used (permanent image in chart),,,,  Narrative:  Start time: 02/28/2021 4:05 PM End time: 02/28/2021 4:15 PM Injection made incrementally with aspirations every 5 mL.  Performed by: Personally  Anesthesiologist: Lyn Hollingshead, MD

## 2021-02-28 NOTE — Brief Op Note (Signed)
02/28/2021  6:55 PM  PATIENT:  Tammy Herman  55 y.o. female  PRE-OPERATIVE DIAGNOSIS:  Left proximal humerus fracture  POST-OPERATIVE DIAGNOSIS:  Left proximal humerus fracture  PROCEDURE:  Procedure(s): OPEN REDUCTION INTERNAL FIXATION (ORIF) PROXIMAL HUMERUS FRACTURE (Left)  SURGEON:  Surgeon(s) and Role:    * Nicholes Stairs, MD - Primary  PHYSICIAN ASSISTANT: Jonelle Sidle, PA-C  ANESTHESIA:   regional and general  EBL:  150 mL   BLOOD ADMINISTERED:none  DRAINS: none   LOCAL MEDICATIONS USED:  NONE  SPECIMEN:  No Specimen  DISPOSITION OF SPECIMEN:  N/A  COUNTS:  YES  TOURNIQUET:  * No tourniquets in log *  DICTATION: .Note written in EPIC  PLAN OF CARE: Discharge to home after PACU  PATIENT DISPOSITION:  PACU - hemodynamically stable.   Delay start of Pharmacological VTE agent (>24hrs) due to surgical blood loss or risk of bleeding: not applicable

## 2021-03-01 ENCOUNTER — Encounter (HOSPITAL_COMMUNITY): Payer: Self-pay | Admitting: Orthopedic Surgery

## 2021-03-01 ENCOUNTER — Telehealth: Payer: Self-pay

## 2021-03-01 NOTE — Telephone Encounter (Signed)
Left detailed message on patient's vm offering her 7:30 am appt at [facility] tomorrow for her EGD. Advised patient that she would have to arrive at [facility] at 6 am with a care partner. Advised patient to please call back either way and that we understand if she can't do it due to short notice.

## 2021-04-12 NOTE — Telephone Encounter (Signed)
Dr. Havery Moros has an opening for next Tuesday, 7-26 at [facility] at 11:45am. Left a detailed message for patient that we could do her EGD currently scheduled for 9-19 next Tuesday.  Asked that she call back asap so she can be instructed

## 2021-04-13 ENCOUNTER — Other Ambulatory Visit: Payer: Self-pay

## 2021-04-13 DIAGNOSIS — K311 Adult hypertrophic pyloric stenosis: Secondary | ICD-10-CM

## 2021-04-17 NOTE — Anesthesia Preprocedure Evaluation (Addendum)
Anesthesia Evaluation  Patient identified by MRN, date of birth, ID band Patient awake    Reviewed: Allergy & Precautions, NPO status , Patient's Chart, lab work & pertinent test results  History of Anesthesia Complications (+) PONV and history of anesthetic complications  Airway Mallampati: II  TM Distance: >3 FB Neck ROM: Full    Dental no notable dental hx.    Pulmonary former smoker,    Pulmonary exam normal        Cardiovascular negative cardio ROS Normal cardiovascular exam     Neuro/Psych Depression negative neurological ROS     GI/Hepatic Neg liver ROS, GERD  Medicated and Controlled,pyloric stricture   Endo/Other  negative endocrine ROS  Renal/GU negative Renal ROS  negative genitourinary   Musculoskeletal  (+) Arthritis ,   Abdominal   Peds  Hematology negative hematology ROS (+)   Anesthesia Other Findings Day of surgery medications reviewed with patient.  Reproductive/Obstetrics negative OB ROS                            Anesthesia Physical Anesthesia Plan  ASA: 2  Anesthesia Plan: MAC   Post-op Pain Management:    Induction:   PONV Risk Score and Plan: Treatment may vary due to age or medical condition and Propofol infusion  Airway Management Planned: Natural Airway and Nasal Cannula  Additional Equipment:   Intra-op Plan:   Post-operative Plan:   Informed Consent: I have reviewed the patients History and Physical, chart, labs and discussed the procedure including the risks, benefits and alternatives for the proposed anesthesia with the patient or authorized representative who has indicated his/her understanding and acceptance.       Plan Discussed with: CRNA  Anesthesia Plan Comments:        Anesthesia Quick Evaluation

## 2021-04-18 ENCOUNTER — Encounter (HOSPITAL_COMMUNITY)
Admission: RE | Disposition: A | Discharge status: Home / Self Care | Payer: Self-pay | Source: Ambulatory Visit | Attending: Gastroenterology

## 2021-04-18 ENCOUNTER — Encounter (HOSPITAL_COMMUNITY): Payer: Self-pay | Admitting: Gastroenterology

## 2021-04-18 ENCOUNTER — Other Ambulatory Visit: Payer: Self-pay | Admitting: Gastroenterology

## 2021-04-18 ENCOUNTER — Other Ambulatory Visit: Payer: Self-pay

## 2021-04-18 ENCOUNTER — Ambulatory Visit (HOSPITAL_COMMUNITY): Payer: 59 | Admitting: Anesthesiology

## 2021-04-18 ENCOUNTER — Ambulatory Visit (HOSPITAL_COMMUNITY)
Admission: RE | Admit: 2021-04-18 | Discharge: 2021-04-18 | Disposition: A | Discharge status: Home / Self Care | Payer: 59 | Source: Ambulatory Visit | Attending: Gastroenterology | Admitting: Gastroenterology

## 2021-04-18 DIAGNOSIS — K319 Disease of stomach and duodenum, unspecified: Secondary | ICD-10-CM | POA: Diagnosis not present

## 2021-04-18 DIAGNOSIS — K259 Gastric ulcer, unspecified as acute or chronic, without hemorrhage or perforation: Secondary | ICD-10-CM | POA: Insufficient documentation

## 2021-04-18 DIAGNOSIS — K311 Adult hypertrophic pyloric stenosis: Secondary | ICD-10-CM | POA: Insufficient documentation

## 2021-04-18 DIAGNOSIS — Z87891 Personal history of nicotine dependence: Secondary | ICD-10-CM | POA: Diagnosis not present

## 2021-04-18 DIAGNOSIS — K31A19 Gastric intestinal metaplasia without dysplasia, unspecified site: Secondary | ICD-10-CM | POA: Diagnosis not present

## 2021-04-18 DIAGNOSIS — K449 Diaphragmatic hernia without obstruction or gangrene: Secondary | ICD-10-CM | POA: Insufficient documentation

## 2021-04-18 DIAGNOSIS — K219 Gastro-esophageal reflux disease without esophagitis: Secondary | ICD-10-CM | POA: Insufficient documentation

## 2021-04-18 HISTORY — PX: ESOPHAGOGASTRODUODENOSCOPY (EGD) WITH PROPOFOL: SHX5813

## 2021-04-18 HISTORY — PX: BIOPSY: SHX5522

## 2021-04-18 SURGERY — ESOPHAGOGASTRODUODENOSCOPY (EGD) WITH PROPOFOL
Anesthesia: Monitor Anesthesia Care

## 2021-04-18 MED ORDER — PROPOFOL 10 MG/ML IV BOLUS
INTRAVENOUS | Status: DC | PRN
  Administered 2021-04-18: 30 mg via INTRAVENOUS
  Administered 2021-04-18 (×5): 20 mg via INTRAVENOUS
  Administered 2021-04-18: 30 mg via INTRAVENOUS

## 2021-04-18 MED ORDER — PANTOPRAZOLE SODIUM 40 MG PO TBEC: 40.0000 mg | DELAYED_RELEASE_TABLET | Freq: Two times a day (BID) | ORAL | 3 refills | Status: AC

## 2021-04-18 MED ORDER — PROMETHAZINE HCL 25 MG/ML IJ SOLN: 6.2500 mg | INTRAMUSCULAR | Status: DC | PRN

## 2021-04-18 MED ORDER — FENTANYL CITRATE (PF) 100 MCG/2ML IJ SOLN
INTRAMUSCULAR | Status: AC
  Filled 2021-04-18: qty 2

## 2021-04-18 MED ORDER — LIDOCAINE 2% (20 MG/ML) 5 ML SYRINGE
INTRAMUSCULAR | Status: DC | PRN
Start: 2021-04-18 — End: 2021-04-18
  Administered 2021-04-18: 100 mg via INTRAVENOUS

## 2021-04-18 MED ORDER — PROPOFOL 500 MG/50ML IV EMUL
INTRAVENOUS | Status: DC | PRN
  Administered 2021-04-18: 150 ug/kg/min via INTRAVENOUS

## 2021-04-18 MED ORDER — SODIUM CHLORIDE 0.9 % IV SOLN: INTRAVENOUS | Status: DC

## 2021-04-18 MED ORDER — LACTATED RINGERS IV SOLN: INTRAVENOUS | Status: DC | PRN

## 2021-04-18 MED ORDER — FENTANYL CITRATE (PF) 100 MCG/2ML IJ SOLN
INTRAMUSCULAR | Status: DC | PRN
  Administered 2021-04-18 (×2): 25 ug via INTRAVENOUS

## 2021-04-18 SURGICAL SUPPLY — 14 items

## 2021-04-18 NOTE — Anesthesia Postprocedure Evaluation (Signed)
Anesthesia Post Note  Patient: Tammy Herman  Procedure(s) Performed: ESOPHAGOGASTRODUODENOSCOPY (EGD) WITH PROPOFOL ESOPHAGEAL BANDING     Patient location during evaluation: PACU Anesthesia Type: MAC Level of consciousness: awake and alert and oriented Pain management: pain level controlled Vital Signs Assessment: post-procedure vital signs reviewed and stable Respiratory status: spontaneous breathing, nonlabored ventilation and respiratory function stable Cardiovascular status: blood pressure returned to baseline Postop Assessment: no apparent nausea or vomiting Anesthetic complications: no   No notable events documented.  Last Vitals:  Vitals:   04/18/21 1130 04/18/21 1140  BP: 130/74 136/89  Pulse: 94 90  Resp: 17 17  Temp:    SpO2: 95% 96%    Last Pain:  Vitals:   04/18/21 1140  TempSrc:   PainSc: 0-No pain                 Brennan Bailey

## 2021-04-18 NOTE — Transfer of Care (Signed)
Immediate Anesthesia Transfer of Care Note  Patient: Tammy Herman  Procedure(s) Performed: Procedure(s): ESOPHAGOGASTRODUODENOSCOPY (EGD) WITH PROPOFOL (N/A) ESOPHAGEAL BANDING (N/A)  Patient Location: PACU  Anesthesia Type:MAC  Level of Consciousness: Patient easily awoken, sedated, comfortable, cooperative, following commands, responds to stimulation.   Airway & Oxygen Therapy: Patient spontaneously breathing, ventilating well, oxygen via simple oxygen mask.  Post-op Assessment: Report given to PACU RN, vital signs reviewed and stable, moving all extremities.   Post vital signs: Reviewed and stable.  Complications: No apparent anesthesia complications  Last Vitals:  Vitals Value Taken Time  BP    Temp    Pulse 94 04/18/21 1118  Resp 9 04/18/21 1118  SpO2 100 % 04/18/21 1118  Vitals shown include unvalidated device data.  Last Pain:  Vitals:   04/18/21 1034  TempSrc: Oral  PainSc: 0-No pain         Complications: No notable events documented.

## 2021-04-18 NOTE — Op Note (Signed)
[facility] Patient Name: Tammy Herman Procedure Date: 04/18/2021 MRN: [id number] Attending MD: Carlota Raspberry. Havery Moros , MD Date of Birth: 19-Jun-1966 CSN: [id number] Age: 55 Admit Type: Outpatient Procedure:                Upper GI endoscopy Indications:              For therapy of pyloric stenosis - dilated to 8.66m                            in April 2022, could not traverse with regular                            adult endoscope, on protonix 473m/ day. At                            hospital for further dilation if amenable Providers:                StCarlota RaspberryArHavery MorosMD, JaKary KosN, RN,                            EdTerrall LaityClae HoKerin RansomAnBenetta Spar                           Technician, HuLerry PatersonCRNA Referring MD:              Medicines:                Monitored Anesthesia Care Complications:            No immediate complications. Estimated blood loss:                            Minimal. Estimated Blood Loss:     Estimated blood loss was minimal. Procedure:                Pre-Anesthesia Assessment:                           - Prior to the procedure, a History and Physical                            was performed, and patient medications and                            allergies were reviewed. The patient's tolerance of                            previous anesthesia was also reviewed. The risks                            and benefits of the procedure and the sedation                            options and risks were discussed with the patient.  All questions were answered, and informed consent                            was obtained. Prior Anticoagulants: The patient has                            taken no previous anticoagulant or antiplatelet                            agents. ASA Grade Assessment: III - A patient with                            severe systemic disease. After reviewing the risks                             and benefits, the patient was deemed in                            satisfactory condition to undergo the procedure.                           After obtaining informed consent, the endoscope was                            passed under direct vision. Throughout the                            procedure, the patient's blood pressure, pulse, and                            oxygen saturations were monitored continuously. The                            GIF-H190 ([id number]) Olympus gastroscope was                            introduced through the mouth, and advanced to the                            second part of duodenum. The upper GI endoscopy was                            accomplished without difficulty. The patient                            tolerated the procedure well. Scope In: Scope Out: Findings:      Esophagogastric landmarks were identified: the Z-line was found at 37       cm, the gastroesophageal junction was found at 37 cm and the upper       extent of the gastric folds was found at 39 cm from the incisors.      A 2 cm hiatal hernia was present.      The exam of the esophagus was otherwise normal.      A benign-appearing,  intrinsic severe stenosis was found at the pylorus.       This was able to be traversed with regular adult upper endoscope       (diameter 9.66m) with appropriate mucosal wrents noted.      One non-bleeding cratered gastric ulcer with no stigmata of bleeding was       found in the pyloric channel entering duodenal bulb. The lesion was 4 mm       in largest dimension. This was not able to be visualized at the last       exam. The stricture was in close approximation and further dilation was       not performed given active ulceration and mucosal wrents with dilation       using the scope itself. Biopsies were taken with a cold forceps for       histology.      The exam of the stomach was otherwise normal.      Biopsies were taken with a cold forceps in the  gastric body, at the       incisura and in the gastric antrum for Helicobacter pylori testing.      The duodenal bulb and second portion of the duodenum were normal. Impression:               - Esophagogastric landmarks identified.                           - 2 cm hiatal hernia.                           - Gastric stenosis was found at the pylorus with                            pyloric channel ulcer able to be visualized,                            biopsied. Stricture dilated using endoscope itself                            and able to be traversed.                           - Normal stomach otherwise - biopsies taken to rule                            out H pylori                           - Normal duodenal bulb and second portion of the                            duodenum. Moderate Sedation:      No moderate sedation, case performed with MAC Recommendation:           - Patient has a contact number available for                            emergencies. The signs and symptoms of potential  delayed complications were discussed with the                            patient. Return to normal activities tomorrow.                            Written discharge instructions were provided to the                            patient.                           - Soft diet.                           - Continue present medications.                           - Increase protonix to twice daily dosing                           - NO NSAIDs                           - Await pathology results.                           - Repeat EGD at the hospital to assess for mucosal                            healing and to further dilate if amenable in                            upcoming 1-2 months, will discuss timing with the                            patient Procedure Code(s):        --- Professional ---                           [phone], Esophagogastroduodenoscopy, flexible,                             transoral; with biopsy, single or multiple Diagnosis Code(s):        --- Professional ---                           K44.9, Diaphragmatic hernia without obstruction or                            gangrene                           K31.1, Adult hypertrophic pyloric stenosis                           K25.9, Gastric ulcer, unspecified as acute or  chronic, without hemorrhage or perforation CPT copyright 2019 American [employer] Association. All rights reserved. The codes documented in this report are preliminary and upon coder review may  be revised to meet current compliance requirements. Remo Lipps P. Olajuwon Fosdick, MD 04/18/2021 11:27:19 AM This report has been signed electronically. Number of Addenda: 0

## 2021-04-18 NOTE — Telephone Encounter (Signed)
Insurance won't pay for BID.  Patient can buy over-the-counter to supplement once a day or use GoodRx (30 day price is around $10)

## 2021-04-18 NOTE — Discharge Instructions (Addendum)
STOP TAKING MELOXICAM  YOU HAD AN ENDOSCOPIC PROCEDURE TODAY: Refer to the procedure report and other information in the discharge instructions given to you for any specific questions about what was found during the examination. If this information does not answer your questions, please call [place] office at [phone] to clarify.   YOU SHOULD EXPECT: Some feelings of bloating in the abdomen. Passage of more gas than usual. Walking can help get rid of the air that was put into your GI tract during the procedure and reduce the bloating. If you had a lower endoscopy (such as a colonoscopy or flexible sigmoidoscopy) you may notice spotting of blood in your stool or on the toilet paper. Some abdominal soreness may be present for a day or two, also.  DIET: Your first meal following the procedure should be a light meal and then it is ok to progress to your normal diet. A half-sandwich or bowl of soup is an example of a good first meal. Heavy or fried foods are harder to digest and may make you feel nauseous or bloated. Drink plenty of fluids but you should avoid alcoholic beverages for 24 hours. If you had a esophageal dilation, please see attached instructions for diet.    ACTIVITY: Your care partner should take you home directly after the procedure. You should plan to take it easy, moving slowly for the rest of the day. You can resume normal activity the day after the procedure however YOU SHOULD NOT DRIVE, use power tools, machinery or perform tasks that involve climbing or major physical exertion for 24 hours (because of the sedation medicines used during the test).   SYMPTOMS TO REPORT IMMEDIATELY: A gastroenterologist can be reached at any hour. Please call [phone]  for any of the following symptoms:    Following upper endoscopy (EGD, EUS, ERCP, esophageal dilation) Vomiting of blood or coffee ground material  New, significant abdominal pain  New, significant chest pain or pain under the  shoulder blades  Painful or persistently difficult swallowing  New shortness of breath  Black, tarry-looking or red, bloody stools  FOLLOW UP:  If any biopsies were taken you will be contacted by phone or by letter within the next 1-3 weeks. Call [phone]  if you have not heard about the biopsies in 3 weeks.  Please also call with any specific questions about appointments or follow up tests.

## 2021-04-18 NOTE — H&P (Signed)
HPI :  55 y/o female with GERD, postprandial symptoms, had pyloric stricture on EGD on 4/22 and underwent mild dilation at that time but could not pass regular adult endoscope. At hospital for repeat exam with dilation if amenable. She had been doing well post dilation initially but has had some recurrent symptoms in recent week with worsening reflux and postprandial symptoms. She has tried to maintain soft diet.   Past Medical History:  Diagnosis Date   Amenorrhea    Arthritis    Depression    GERD (gastroesophageal reflux disease)    Hyperlipidemia    PONV (postoperative nausea and vomiting)      Past Surgical History:  Procedure Laterality Date   BREAST REDUCTION SURGERY     CARPAL TUNNEL RELEASE Left    KNEE ARTHROSCOPY WITH LATERAL MENISECTOMY Right 10/18/2014   Procedure: RIGHT KNEE ARTHROSCOPY WITH LATERAL MENISECTOMY;  Surgeon: Mauri Pole, MD;  Location: [facility];  Service: Orthopedics;  Laterality: Right;   KNEE SURGERY Left 08/2012   ORIF HUMERUS FRACTURE Left 02/28/2021   Procedure: OPEN REDUCTION INTERNAL FIXATION (ORIF) PROXIMAL HUMERUS FRACTURE;  Surgeon: Nicholes Stairs, MD;  Location: [facility];  Service: Orthopedics;  Laterality: Left;   PARTIAL KNEE ARTHROPLASTY Right 10/18/2014   Procedure: RIGHT MEDIAL COMPARTMENTAL UNI-KNEE ARTHROPLASTY;  Surgeon: Mauri Pole, MD;  Location: [facility];  Service: Orthopedics;  Laterality: Right;   TOTAL KNEE ARTHROPLASTY Left 06/20/2015   Procedure: TOTAL LEFT KNEE ARTHROPLASTY;  Surgeon: Paralee Cancel, MD;  Location: [facility];  Service: Orthopedics;  Laterality: Left;   TUBAL LIGATION  2001   Family History  Problem Relation Age of Onset   Hypertension Mother    Diabetes Father        Type 2   Diabetes Sister        Type 2   Breast cancer Maternal Aunt    Colon cancer Neg Hx    Stomach cancer Neg Hx    Esophageal cancer Neg Hx    Rectal cancer Neg Hx    Social History   Tobacco Use   Smoking status: Former     Types: Cigarettes    Quit date: 07/27/2006    Years since quitting: 14.7   Smokeless tobacco: Never  Vaping Use   Vaping Use: Never used  Substance Use Topics   Alcohol use: Yes    Alcohol/week: 7.0 standard drinks    Types: 7 Glasses of wine per week    Comment: 1-2 glasses wine nightly per pt   Drug use: No   Current Facility-Administered Medications  Medication Dose Route Frequency Provider Last Rate Last Admin   0.9 %  sodium chloride infusion   Intravenous Continuous Callin Ashe, Carlota Raspberry, MD       promethazine (PHENERGAN) injection 6.25-12.5 mg  6.25-12.5 mg Intravenous Q15 min PRN Brennan Bailey, MD       No Known Allergies   Review of Systems: All systems reviewed and negative except where noted in HPI.    No results found.  Physical Exam: BP 123/82   Pulse 89   Temp 99 F (37.2 C) (Oral)   Resp 19   Ht '5\' 5"'$  (1.651 m)   Wt 90.7 kg   LMP 09/12/2014   SpO2 98%   BMI 33.28 kg/m  Constitutional: Pleasant,well-developed, female in no acute distress.  Cardiovascular: Normal rate, regular rhythm.  Pulmonary/chest: Effort normal and breath sounds normal.  Abdominal: Soft, nondistended, nontender.  Extremities: no edema Lymphadenopathy: No  cervical adenopathy noted. Neurological: Alert and oriented to person place and time. Skin: Skin is warm and dry. No rashes noted. Psychiatric: Normal mood and affect. Behavior is normal.   ASSESSMENT AND PLAN: 55 y/o female here for EGD at the hospital for dilation of pyloric stricture, could not traverse it with regular endoscope but could see through it and mild balloon dilation performed. At hospital for reassessment and additional dilation if amenable. I have discussed risks / benefits of anesthesia and endoscopy with dilation, risk for perforation and bleeding. She agrees with the assessment and plan as outlined, all questions answered. Further recommendations pending the results.  Jolly Mango, MD [facility]  Gastroenterology

## 2021-04-19 LAB — SURGICAL PATHOLOGY

## 2021-04-20 ENCOUNTER — Other Ambulatory Visit: Payer: Self-pay

## 2021-04-20 DIAGNOSIS — K311 Adult hypertrophic pyloric stenosis: Secondary | ICD-10-CM

## 2021-04-20 DIAGNOSIS — K219 Gastro-esophageal reflux disease without esophagitis: Secondary | ICD-10-CM

## 2021-04-20 NOTE — Progress Notes (Signed)
Per Dr. Havery Moros patient needs EGD in Sept

## 2021-06-08 ENCOUNTER — Other Ambulatory Visit: Payer: Self-pay

## 2021-06-08 DIAGNOSIS — K219 Gastro-esophageal reflux disease without esophagitis: Secondary | ICD-10-CM

## 2021-06-08 DIAGNOSIS — K311 Adult hypertrophic pyloric stenosis: Secondary | ICD-10-CM

## 2021-06-11 NOTE — Anesthesia Preprocedure Evaluation (Addendum)
Anesthesia Evaluation  Patient identified by MRN, date of birth, ID band Patient awake    Reviewed: Allergy & Precautions, NPO status , Patient's Chart, lab work & pertinent test results  History of Anesthesia Complications (+) PONV and history of anesthetic complications  Airway Mallampati: II  TM Distance: >3 FB Neck ROM: Full    Dental  (+) Teeth Intact, Dental Advisory Given   Pulmonary former smoker,    Pulmonary exam normal breath sounds clear to auscultation       Cardiovascular negative cardio ROS Normal cardiovascular exam Rhythm:Regular Rate:Normal     Neuro/Psych PSYCHIATRIC DISORDERS Depression negative neurological ROS     GI/Hepatic Neg liver ROS, PUD, GERD  Medicated,gastric ulcer, pyloric stenosis   Endo/Other  negative endocrine ROS  Renal/GU negative Renal ROS     Musculoskeletal  (+) Arthritis ,   Abdominal   Peds  Hematology negative hematology ROS (+)   Anesthesia Other Findings   Reproductive/Obstetrics                            Anesthesia Physical Anesthesia Plan  ASA: 2  Anesthesia Plan: MAC   Post-op Pain Management:    Induction: Intravenous  PONV Risk Score and Plan: 3 and Propofol infusion and Treatment may vary due to age or medical condition  Airway Management Planned: Nasal Cannula and Natural Airway  Additional Equipment:   Intra-op Plan:   Post-operative Plan:   Informed Consent: I have reviewed the patients History and Physical, chart, labs and discussed the procedure including the risks, benefits and alternatives for the proposed anesthesia with the patient or authorized representative who has indicated his/her understanding and acceptance.     Dental advisory given  Plan Discussed with: CRNA  Anesthesia Plan Comments:        Anesthesia Quick Evaluation

## 2021-06-12 ENCOUNTER — Ambulatory Visit (HOSPITAL_COMMUNITY): Payer: 59 | Admitting: Anesthesiology

## 2021-06-12 ENCOUNTER — Encounter (HOSPITAL_COMMUNITY)
Admission: RE | Disposition: A | Discharge status: Home / Self Care | Payer: Self-pay | Source: Home / Self Care | Attending: Gastroenterology

## 2021-06-12 ENCOUNTER — Other Ambulatory Visit: Payer: Self-pay

## 2021-06-12 ENCOUNTER — Ambulatory Visit (HOSPITAL_COMMUNITY)
Admission: RE | Admit: 2021-06-12 | Discharge: 2021-06-12 | Disposition: A | Discharge status: Home / Self Care | Payer: 59 | Attending: Gastroenterology | Admitting: Gastroenterology

## 2021-06-12 ENCOUNTER — Encounter: Payer: Self-pay | Admitting: Gastroenterology

## 2021-06-12 ENCOUNTER — Encounter (HOSPITAL_COMMUNITY): Payer: Self-pay | Admitting: Gastroenterology

## 2021-06-12 DIAGNOSIS — Z87891 Personal history of nicotine dependence: Secondary | ICD-10-CM | POA: Diagnosis not present

## 2021-06-12 DIAGNOSIS — K219 Gastro-esophageal reflux disease without esophagitis: Secondary | ICD-10-CM

## 2021-06-12 DIAGNOSIS — Z79891 Long term (current) use of opiate analgesic: Secondary | ICD-10-CM | POA: Diagnosis not present

## 2021-06-12 DIAGNOSIS — Z96653 Presence of artificial knee joint, bilateral: Secondary | ICD-10-CM | POA: Insufficient documentation

## 2021-06-12 DIAGNOSIS — K449 Diaphragmatic hernia without obstruction or gangrene: Secondary | ICD-10-CM | POA: Insufficient documentation

## 2021-06-12 DIAGNOSIS — K311 Adult hypertrophic pyloric stenosis: Secondary | ICD-10-CM | POA: Diagnosis not present

## 2021-06-12 DIAGNOSIS — Z79899 Other long term (current) drug therapy: Secondary | ICD-10-CM | POA: Insufficient documentation

## 2021-06-12 DIAGNOSIS — Z8719 Personal history of other diseases of the digestive system: Secondary | ICD-10-CM | POA: Diagnosis not present

## 2021-06-12 HISTORY — PX: ESOPHAGOGASTRODUODENOSCOPY (EGD) WITH PROPOFOL: SHX5813

## 2021-06-12 SURGERY — ESOPHAGOGASTRODUODENOSCOPY (EGD) WITH PROPOFOL
Anesthesia: Monitor Anesthesia Care

## 2021-06-12 MED ORDER — ONDANSETRON HCL 4 MG/2ML IJ SOLN
INTRAMUSCULAR | Status: DC | PRN
Start: 2021-06-12 — End: 2021-06-12
  Administered 2021-06-12: 4 mg via INTRAVENOUS

## 2021-06-12 MED ORDER — MIDAZOLAM HCL 2 MG/2ML IJ SOLN
INTRAMUSCULAR | Status: AC
  Filled 2021-06-12: qty 2

## 2021-06-12 MED ORDER — PROPOFOL 10 MG/ML IV BOLUS
INTRAVENOUS | Status: DC | PRN
  Administered 2021-06-12: 50 mg via INTRAVENOUS
  Administered 2021-06-12 (×4): 20 mg via INTRAVENOUS
  Administered 2021-06-12: 40 mg via INTRAVENOUS

## 2021-06-12 MED ORDER — PROPOFOL 500 MG/50ML IV EMUL
INTRAVENOUS | Status: AC
  Filled 2021-06-12: qty 100

## 2021-06-12 MED ORDER — LIDOCAINE 2% (20 MG/ML) 5 ML SYRINGE
INTRAMUSCULAR | Status: DC | PRN
  Administered 2021-06-12: 80 mg via INTRAVENOUS

## 2021-06-12 MED ORDER — LACTATED RINGERS IV SOLN: INTRAVENOUS | Status: DC | PRN

## 2021-06-12 MED ORDER — SODIUM CHLORIDE 0.9 % IV SOLN: INTRAVENOUS | Status: DC

## 2021-06-12 MED ORDER — DEXMEDETOMIDINE (PRECEDEX) IN NS 20 MCG/5ML (4 MCG/ML) IV SYRINGE
PREFILLED_SYRINGE | INTRAVENOUS | Status: DC | PRN
  Administered 2021-06-12: 8 ug via INTRAVENOUS

## 2021-06-12 MED ORDER — PROPOFOL 500 MG/50ML IV EMUL
INTRAVENOUS | Status: DC | PRN
  Administered 2021-06-12: 150 ug/kg/min via INTRAVENOUS

## 2021-06-12 SURGICAL SUPPLY — 14 items

## 2021-06-12 NOTE — Op Note (Signed)
[facility] Patient Name: Tammy Herman Procedure Date: 06/12/2021 MRN: [id number] Attending MD: Carlota Raspberry. Havery Moros , MD Date of Birth: 08/09/1966 CSN: [id number] Age: 55 Admit Type: Outpatient Procedure:                Upper GI endoscopy Indications:              For therapy of pyloric stenosis - history of ulcer                            within the pyloric channel, duodenal bulb - now on                            protonix BID and holding NSAIDs, here for                            reassessment and dilation as needed Providers:                Carlota Raspberry. Havery Moros, MD, Dulcy Fanny, Tyrone Apple, Technician, Luan Moore, Technician,                            Virgia Land, CRNA Referring MD:              Medicines:                Monitored Anesthesia Care Complications:            No immediate complications. Estimated blood loss:                            Minimal. Estimated Blood Loss:     Estimated blood loss was minimal. Procedure:                Pre-Anesthesia Assessment:                           - Prior to the procedure, a History and Physical                            was performed, and patient medications and                            allergies were reviewed. The patient's tolerance of                            previous anesthesia was also reviewed. The risks                            and benefits of the procedure and the sedation                            options and risks were discussed with the patient.  All questions were answered, and informed consent                            was obtained. Prior Anticoagulants: The patient has                            taken no previous anticoagulant or antiplatelet                            agents. ASA Grade Assessment: II - A patient with                            mild systemic disease. After reviewing the risks                            and  benefits, the patient was deemed in                            satisfactory condition to undergo the procedure.                           After obtaining informed consent, the endoscope was                            passed under direct vision. Throughout the                            procedure, the patient's blood pressure, pulse, and                            oxygen saturations were monitored continuously. The                            GIF-H190 ([id number]) Olympus endoscope was introduced                            through the mouth, and advanced to the second part                            of duodenum. The upper GI endoscopy was                            accomplished without difficulty. The patient                            tolerated the procedure well. Scope In: Scope Out: Findings:      Esophagogastric landmarks were identified: the Z-line was found at 38       cm, the gastroesophageal junction was found at 38 cm and the upper       extent of the gastric folds was found at 40 cm from the incisors.      A 2 cm hiatal hernia was present.      The exam of the esophagus was otherwise normal.      A benign-appearing,  intrinsic moderate stenosis was found at the       pylorus. This was traversed. The previously noted ulcer within the       pyloric channel and duodenal bulb has intervally healed. A TTS dilator       was passed through the scope. Dilation with a 07-05-11 mm pyloric       balloon dilator was performed to 67m with appropriate mucosal wrents       noted.      The exam of the stomach was otherwise normal.      There was some scarring and mild narrowing of the duodenal bulb but       patent. The duodenal bulb and second portion of the duodenum were       otherwise normal. Impression:               - Esophagogastric landmarks identified.                           - 2 cm hiatal hernia.                           - Normal esophagus otherwise                           -  Gastric stenosis was found at the pylorus.                            Interval healing of pyloric ulcer. Dilated with                            good result with 125m Previously biopsied and                            benign, further biopsies not taken today,                           - Normal duodenal bulb with mild stenosis, and                            second portion of the duodenum otherwise normal. Moderate Sedation:      No moderate sedation, case performed with MAC Recommendation:           - Patient has a contact number available for                            emergencies. The signs and symptoms of potential                            delayed complications were discussed with the                            patient. Return to normal activities tomorrow.                            Written discharge instructions were provided to the  patient.                           - Advance diet as tolerated.                           - Continue present medications.                           - Contact me if symptoms persist, may benefit from                            additional dilation if symptoms persistent                           - Continue to avoid / hold NSAIDs Procedure Code(s):        --- Professional ---                           [id number], Esophagogastroduodenoscopy, flexible,                            transoral; with dilation of gastric/duodenal                            stricture(s) (eg, balloon, bougie) Diagnosis Code(s):        --- Professional ---                           K44.9, Diaphragmatic hernia without obstruction or                            gangrene                           K31.1, Adult hypertrophic pyloric stenosis CPT copyright 2019 American Medical Association. All rights reserved. The codes documented in this report are preliminary and upon coder review may  be revised to meet current compliance requirements. Remo Lipps P. Havery Moros,  MD 06/12/2021 8:02:28 AM This report has been signed electronically. Number of Addenda: 0

## 2021-06-12 NOTE — Anesthesia Postprocedure Evaluation (Signed)
Anesthesia Post Note  Patient: Tammy Herman  Procedure(s) Performed: ESOPHAGOGASTRODUODENOSCOPY (EGD) WITH PROPOFOL Balloon dilation wire-guided     Patient location during evaluation: Endoscopy Anesthesia Type: MAC Level of consciousness: awake and alert Pain management: pain level controlled Vital Signs Assessment: post-procedure vital signs reviewed and stable Respiratory status: spontaneous breathing, nonlabored ventilation, respiratory function stable and patient connected to nasal cannula oxygen Cardiovascular status: stable and blood pressure returned to baseline Postop Assessment: no apparent nausea or vomiting Anesthetic complications: no   No notable events documented.  Last Vitals:  Vitals:   06/12/21 0810 06/12/21 0820  BP: (!) 156/83 115/75  Pulse: 88 93  Resp: 16 17  Temp: 36.6 C   SpO2: 100% 94%    Last Pain:  Vitals:   06/12/21 0820  TempSrc:   PainSc: 0-No pain                 Catalina Gravel

## 2021-06-12 NOTE — Discharge Instructions (Signed)
YOU HAD AN ENDOSCOPIC PROCEDURE TODAY: Refer to the procedure report and other information in the discharge instructions given to you for any specific questions about what was found during the examination. If this information does not answer your questions, please call [facility] office at [phone] to clarify.   YOU SHOULD EXPECT: Some feelings of bloating in the abdomen. Passage of more gas than usual. Walking can help get rid of the air that was put into your GI tract during the procedure and reduce the bloating. If you had a lower endoscopy (such as a colonoscopy or flexible sigmoidoscopy) you may notice spotting of blood in your stool or on the toilet paper. Some abdominal soreness may be present for a day or two, also.  DIET: Your first meal following the procedure should be a light meal and then it is ok to progress to your normal diet. A half-sandwich or bowl of soup is an example of a good first meal. Heavy or fried foods are harder to digest and may make you feel nauseous or bloated. Drink plenty of fluids but you should avoid alcoholic beverages for 24 hours. If you had a esophageal dilation, please see attached instructions for diet.    ACTIVITY: Your care partner should take you home directly after the procedure. You should plan to take it easy, moving slowly for the rest of the day. You can resume normal activity the day after the procedure however YOU SHOULD NOT DRIVE, use power tools, machinery or perform tasks that involve climbing or major physical exertion for 24 hours (because of the sedation medicines used during the test).   SYMPTOMS TO REPORT IMMEDIATELY: A gastroenterologist can be reached at any hour. Please call [phone]  for any of the following symptoms:   Following upper endoscopy (EGD, EUS, ERCP, esophageal dilation) Vomiting of blood or coffee ground material  New, significant abdominal pain  New, significant chest pain or pain under the shoulder blades  Painful or  persistently difficult swallowing  New shortness of breath  Black, tarry-looking or red, bloody stools  FOLLOW UP:  If any biopsies were taken you will be contacted by phone or by letter within the next 1-3 weeks. Call [phone]  if you have not heard about the biopsies in 3 weeks.  Please also call with any specific questions about appointments or follow up tests.  

## 2021-06-12 NOTE — H&P (Signed)
[facility] History and Physical   Primary Care Physician:  Prince Solian, MD   Reason for Procedure:   Pyloric stricture / ulcer  Plan:    EGD with possible balloon dilation     HPI: Tammy Herman is a 55 y.o. female  here for EGD to reassess pyloric stricture / ulcer, history of partial outlet obstruction, here for reassessment and determine if dilation is needed. Patient denies nausea vomiting but does feel some discomfort / bloating after eating and some indigestion, not back to normal yet. On protonix BID and stopped NSAIDs since last procedure in July. No cardiopulmonary symptoms. Otherwise feeling well.    Have discussed risks / benefits of EGD / dilation with the patient and she wishes to proceed. All questions answered.  Past Medical History:  Diagnosis Date   Amenorrhea    Arthritis    Depression    GERD (gastroesophageal reflux disease)    Hyperlipidemia    PONV (postoperative nausea and vomiting)     Past Surgical History:  Procedure Laterality Date   BIOPSY  04/18/2021   Procedure: BIOPSY;  Surgeon: Yetta Flock, MD;  Location: [facility];  Service: Gastroenterology;;   BREAST REDUCTION SURGERY     CARPAL TUNNEL RELEASE Left    ESOPHAGOGASTRODUODENOSCOPY (EGD) WITH PROPOFOL N/A 04/18/2021   Procedure: ESOPHAGOGASTRODUODENOSCOPY (EGD) WITH PROPOFOL;  Surgeon: Yetta Flock, MD;  Location: [facility];  Service: Gastroenterology;  Laterality: N/A;   KNEE ARTHROSCOPY WITH LATERAL MENISECTOMY Right 10/18/2014   Procedure: RIGHT KNEE ARTHROSCOPY WITH LATERAL MENISECTOMY;  Surgeon: Mauri Pole, MD;  Location: [facility];  Service: Orthopedics;  Laterality: Right;   KNEE SURGERY Left 08/2012   ORIF HUMERUS FRACTURE Left 02/28/2021   Procedure: OPEN REDUCTION INTERNAL FIXATION (ORIF) PROXIMAL HUMERUS FRACTURE;  Surgeon: Nicholes Stairs, MD;  Location: [facility];  Service: Orthopedics;  Laterality: Left;   PARTIAL KNEE ARTHROPLASTY Right  10/18/2014   Procedure: RIGHT MEDIAL COMPARTMENTAL UNI-KNEE ARTHROPLASTY;  Surgeon: Mauri Pole, MD;  Location: [facility];  Service: Orthopedics;  Laterality: Right;   TOTAL KNEE ARTHROPLASTY Left 06/20/2015   Procedure: TOTAL LEFT KNEE ARTHROPLASTY;  Surgeon: Paralee Cancel, MD;  Location: [facility];  Service: Orthopedics;  Laterality: Left;   TUBAL LIGATION  2001    Prior to Admission medications   Medication Sig Start Date End Date Taking? Authorizing Provider  acetaminophen (TYLENOL) 500 MG tablet Take 1,000 mg by mouth every 6 (six) hours as needed for moderate pain or headache.   Yes [provider]  pantoprazole (PROTONIX) 40 MG tablet Take 1 tablet (40 mg total) by mouth 2 (two) times daily. Patient taking differently: Take 40 mg by mouth See admin instructions. Take 40 mg daily, may take a second 40 mg dose as needed for acid reflux 04/18/21  Yes Shantara Goosby, Carlota Raspberry, MD  traMADol (ULTRAM) 50 MG tablet Take 100 mg by mouth daily at 12 noon. 03/15/21  Yes [provider]  traZODone (DESYREL) 100 MG tablet Take 200 mg by mouth at bedtime.   Yes [provider]  venlafaxine (EFFEXOR) 75 MG tablet Take 150 mg by mouth daily.   Yes [provider]  dexlansoprazole (DEXILANT) 60 MG capsule Take 1 capsule (60 mg total) by mouth daily. Patient not taking: No sig reported 11/15/20   Zehr, Laban Emperor, PA-C  methocarbamol (ROBAXIN) 500 MG tablet Take 1 tablet (500 mg total) by mouth every 6 (six) hours as needed for muscle spasms. Patient not taking: No  sig reported 02/28/21   Nicholes Stairs, MD  ondansetron (ZOFRAN) 4 MG tablet Take 1 tablet (4 mg total) by mouth daily as needed for nausea or vomiting. Patient not taking: No sig reported 02/28/21 02/28/22  Nicholes Stairs, MD    Current Facility-Administered Medications  Medication Dose Route Frequency Provider Last Rate Last Admin   0.9 %  sodium chloride infusion   Intravenous Continuous Desirea Mizrahi, Carlota Raspberry,  MD        Allergies as of 04/20/2021   (No Known Allergies)    Family History  Problem Relation Age of Onset   Hypertension Mother    Diabetes Father        Type 2   Diabetes Sister        Type 2   Breast cancer Maternal Aunt    Colon cancer Neg Hx    Stomach cancer Neg Hx    Esophageal cancer Neg Hx    Rectal cancer Neg Hx     Social History   Socioeconomic History   Marital status: Married    Spouse name: Not on file   Number of children: Not on file   Years of education: Not on file   Highest education level: Not on file  Occupational History   Not on file  Tobacco Use   Smoking status: Former    Types: Cigarettes    Quit date: 07/27/2006    Years since quitting: 14.8   Smokeless tobacco: Never  Vaping Use   Vaping Use: Never used  Substance and Sexual Activity   Alcohol use: Yes    Alcohol/week: 7.0 standard drinks    Types: 7 Glasses of wine per week    Comment: 1-2 glasses wine nightly per pt   Drug use: No   Sexual activity: Yes    Partners: Male    Birth control/protection: Surgical    Comment: Tubal ligation  Other Topics Concern   Not on file  Social History Narrative   Not on file   Social Determinants of Health   Financial Resource Strain: Not on file  Food Insecurity: Not on file  Transportation Needs: Not on file  Physical Activity: Not on file  Stress: Not on file  Social Connections: Not on file  Intimate Partner Violence: Not on file    Review of Systems: All other review of systems negative except as mentioned in the HPI.    Physical Exam: Vital signs BP 123/85   Pulse 92   Temp 98.2 F (36.8 C) (Temporal)   Ht 5\' 6"  (1.676 m)   Wt 90.7 kg   LMP 09/12/2014   SpO2 97%   BMI 32.28 kg/m   General:   Alert,  Well-developed, well-nourished, pleasant and cooperative in NAD Lungs:  Clear throughout to auscultation.   Heart:  Regular rate and rhythm Abdomen:  Soft, nontender and nondistended.   Neuro/Psych:  Alert and  cooperative. Normal mood and affect. A and O x 3  Jolly Mango, MD [facility]

## 2021-06-12 NOTE — Transfer of Care (Signed)
Immediate Anesthesia Transfer of Care Note  Patient: Tammy Herman  Procedure(s) Performed: ESOPHAGOGASTRODUODENOSCOPY (EGD) WITH PROPOFOL Balloon dilation wire-guided  Patient Location: PACU and Endoscopy Unit  Anesthesia Type:MAC  Level of Consciousness: awake, alert  and oriented  Airway & Oxygen Therapy: Patient Spontanous Breathing and Patient connected to face mask oxygen  Post-op Assessment: Report given to RN and Post -op Vital signs reviewed and stable  Post vital signs: Reviewed and stable  Last Vitals:  Vitals Value Taken Time  BP    Temp    Pulse    Resp    SpO2      Last Pain:  Vitals:   06/12/21 0703  TempSrc: Temporal  PainSc: 0-No pain         Complications: No notable events documented.

## 2021-06-13 ENCOUNTER — Encounter (HOSPITAL_COMMUNITY): Payer: Self-pay | Admitting: Gastroenterology

## 2021-10-23 ENCOUNTER — Other Ambulatory Visit: Payer: Self-pay | Admitting: Internal Medicine

## 2021-10-23 DIAGNOSIS — E785 Hyperlipidemia, unspecified: Secondary | ICD-10-CM

## 2022-11-13 ENCOUNTER — Other Ambulatory Visit: Payer: Self-pay | Admitting: Internal Medicine

## 2022-11-13 DIAGNOSIS — Z8249 Family history of ischemic heart disease and other diseases of the circulatory system: Secondary | ICD-10-CM

## 2022-11-13 DIAGNOSIS — E785 Hyperlipidemia, unspecified: Secondary | ICD-10-CM

## 2022-11-14 ENCOUNTER — Other Ambulatory Visit: Payer: Self-pay | Admitting: Internal Medicine

## 2022-11-14 DIAGNOSIS — R011 Cardiac murmur, unspecified: Secondary | ICD-10-CM

## 2022-11-23 ENCOUNTER — Other Ambulatory Visit: Payer: 59

## 2022-12-02 IMAGING — RF DG SHOULDER 1V*L*
1 series · 2 of 2 positions shown · non-contrast
Comparison: 02/21/2021 left shoulder CT

CLINICAL DATA: ORIF proximal left humerus fracture

EXAM:
LEFT SHOULDER; DG C-ARM 1-60 MIN-NO REPORT

[Series 1: unknown protocol · 0.20mm/px · 2 of 2 slices shown]
[im 1/2]
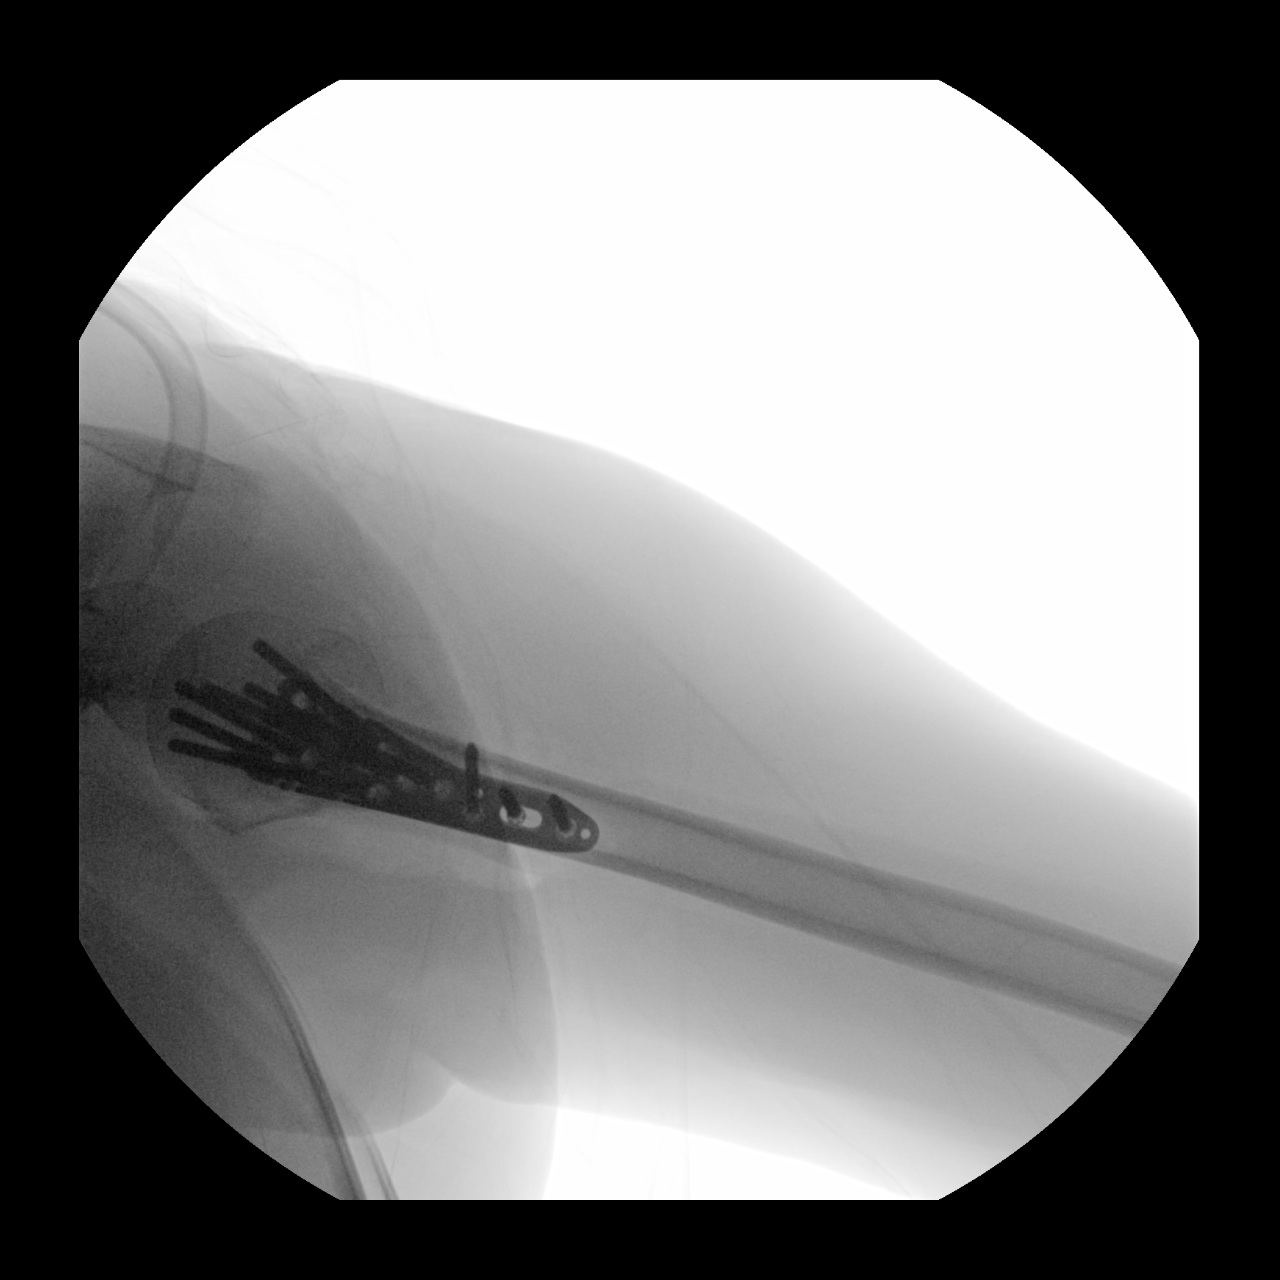
[im 2/2]
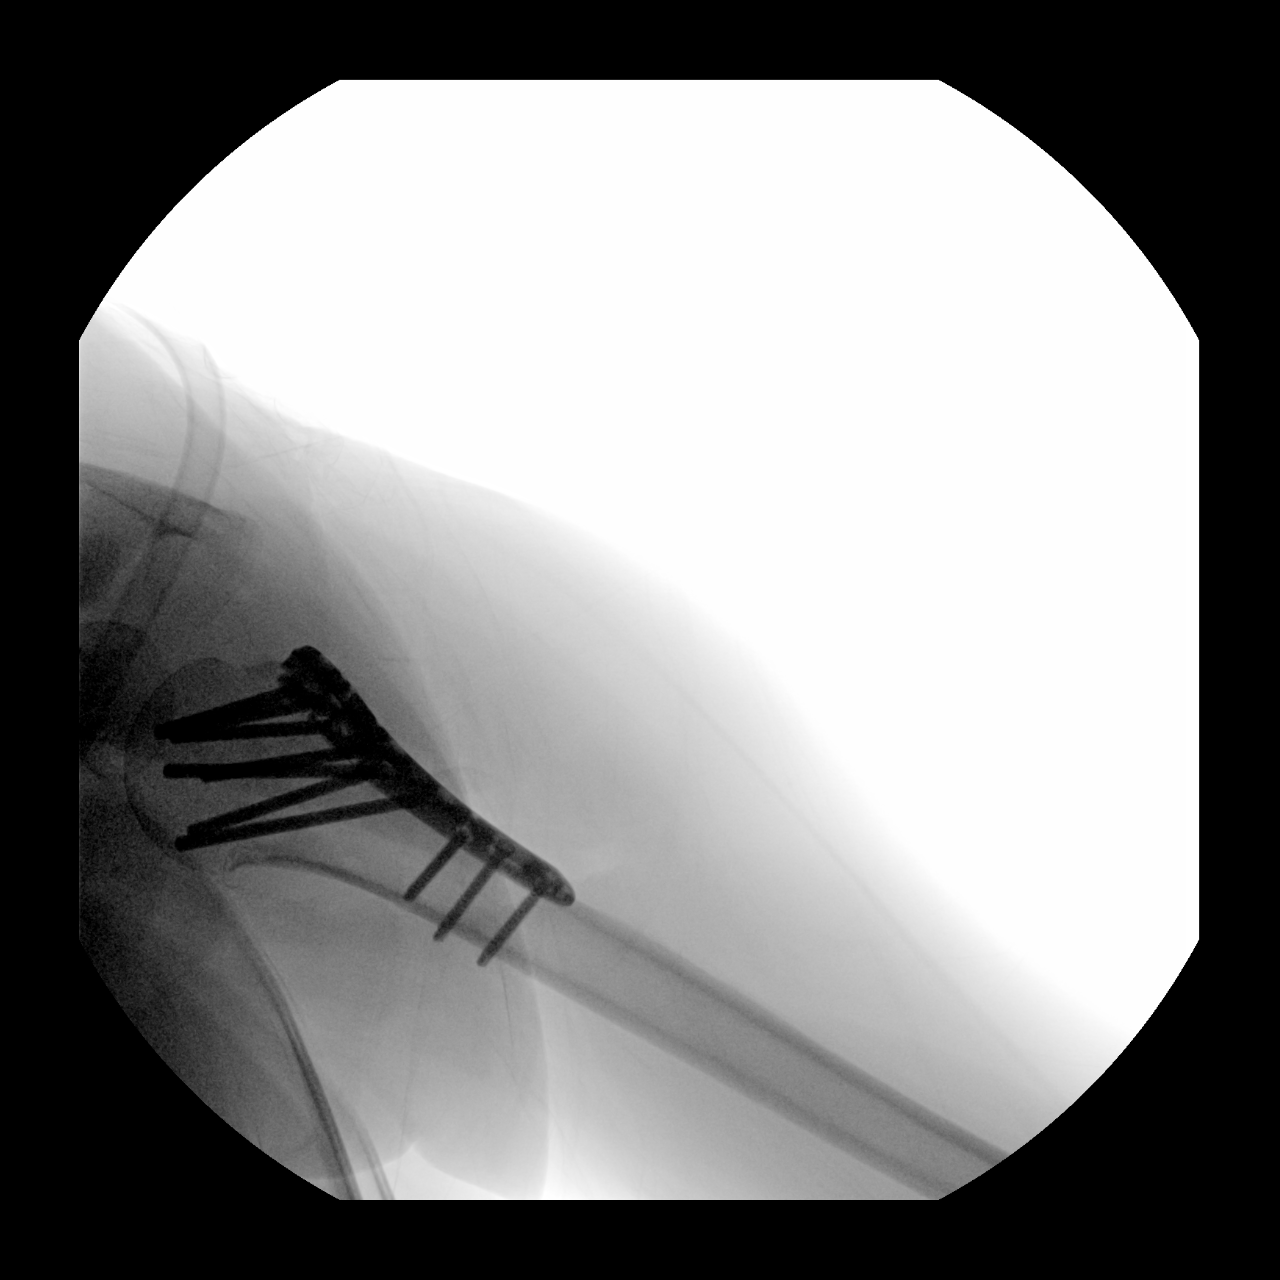

[2 of 2 positions shown; findings below may reference images not displayed]

FLUOROSCOPY TIME:  Fluoroscopy Time:  0 minutes 14 seconds

Number of Acquired Spot Images: 2
FINDINGS: Nondiagnostic spot fluoroscopic intraoperative proximal left humerus
radiographs demonstrate transfixation of comminuted proximal left
humerus fracture in near-anatomic alignment by lateral surgical
plate with multiple interlocking screws.
IMPRESSION: Intraoperative fluoroscopic guidance for ORIF proximal left humerus
fracture.

## 2022-12-02 IMAGING — RF DG C-ARM 1-60 MIN-NO REPORT
1 series · 2 of 2 positions shown · non-contrast
Comparison: 02/21/2021 left shoulder CT

CLINICAL DATA: ORIF proximal left humerus fracture

EXAM:
LEFT SHOULDER; DG C-ARM 1-60 MIN-NO REPORT

[Series 1: unknown protocol · 0.20mm/px · 2 of 2 slices shown]
[im 1/2]
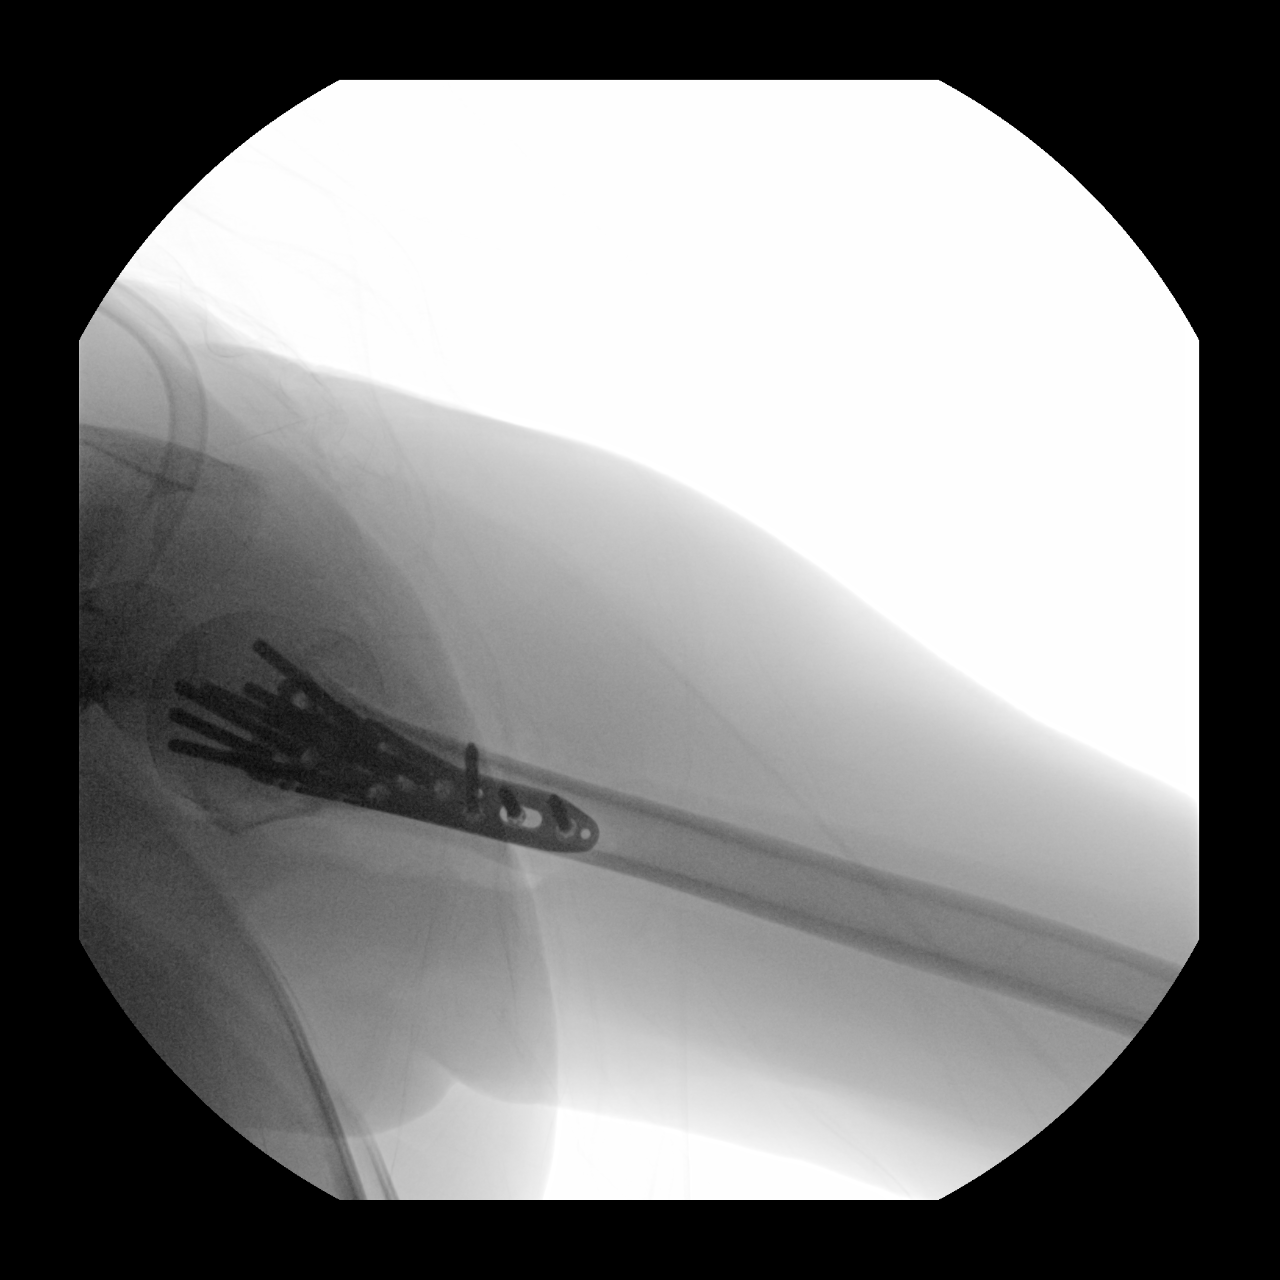
[im 2/2]
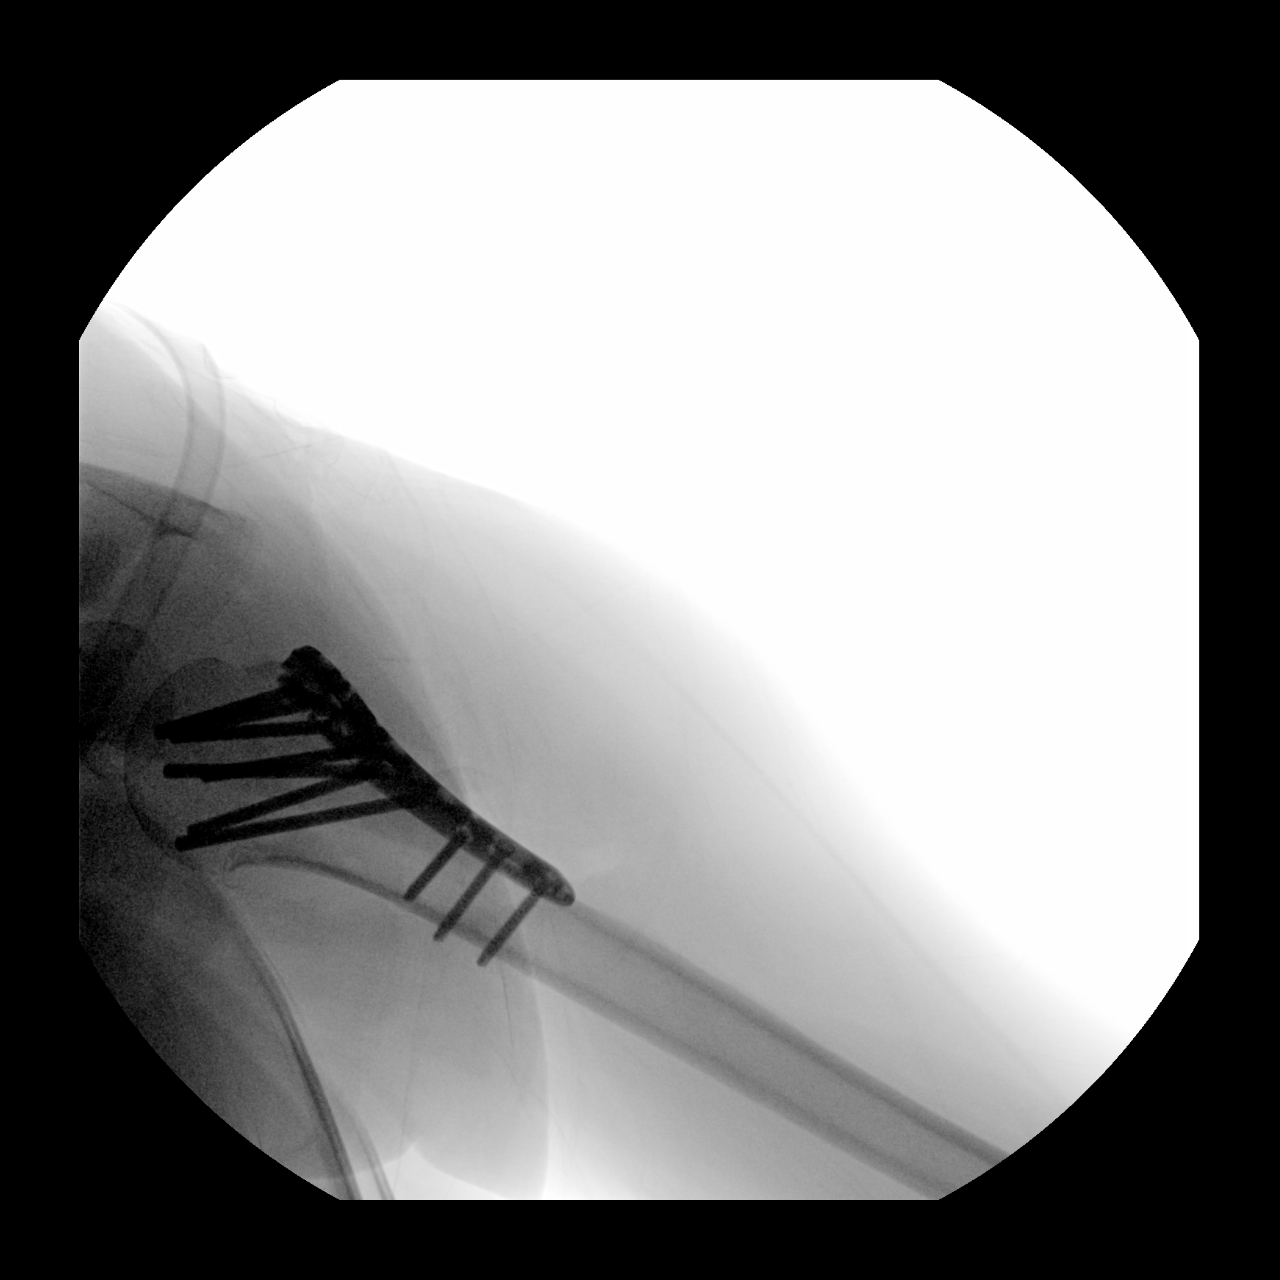

[2 of 2 positions shown; findings below may reference images not displayed]

FLUOROSCOPY TIME:  Fluoroscopy Time:  0 minutes 14 seconds

Number of Acquired Spot Images: 2
FINDINGS: Nondiagnostic spot fluoroscopic intraoperative proximal left humerus
radiographs demonstrate transfixation of comminuted proximal left
humerus fracture in near-anatomic alignment by lateral surgical
plate with multiple interlocking screws.
IMPRESSION: Intraoperative fluoroscopic guidance for ORIF proximal left humerus
fracture.

## 2022-12-10 ENCOUNTER — Ambulatory Visit: Payer: 59

## 2022-12-10 DIAGNOSIS — R011 Cardiac murmur, unspecified: Secondary | ICD-10-CM

## 2022-12-13 ENCOUNTER — Ambulatory Visit
Admission: RE | Admit: 2022-12-13 | Discharge: 2022-12-13 | Disposition: A | Discharge status: Home / Self Care | Payer: No Typology Code available for payment source | Source: Ambulatory Visit | Attending: Internal Medicine | Admitting: Internal Medicine

## 2022-12-13 DIAGNOSIS — E785 Hyperlipidemia, unspecified: Secondary | ICD-10-CM

## 2022-12-13 DIAGNOSIS — Z8249 Family history of ischemic heart disease and other diseases of the circulatory system: Secondary | ICD-10-CM

## 2022-12-19 ENCOUNTER — Ambulatory Visit: Payer: 59 | Admitting: Cardiology

## 2022-12-19 ENCOUNTER — Encounter: Payer: Self-pay | Admitting: Cardiology

## 2022-12-19 VITALS — BP 125/89 | HR 109 | Resp 16 | Ht 66.0 in | Wt 222.2 lb

## 2022-12-19 DIAGNOSIS — E782 Mixed hyperlipidemia: Secondary | ICD-10-CM | POA: Insufficient documentation

## 2022-12-19 DIAGNOSIS — R931 Abnormal findings on diagnostic imaging of heart and coronary circulation: Secondary | ICD-10-CM | POA: Insufficient documentation

## 2022-12-19 DIAGNOSIS — R000 Tachycardia, unspecified: Secondary | ICD-10-CM

## 2022-12-19 MED ORDER — EZETIMIBE 10 MG PO TABS: 10.0000 mg | ORAL_TABLET | Freq: Every day | ORAL | 3 refills | Status: AC

## 2022-12-19 NOTE — Progress Notes (Signed)
Patient referred by Tammy Solian, MD for Coronary atherosclerosis   Subjective:   Tammy Herman, female    DOB: 25-Mar-1966, 57 y.o.   MRN: [id number]   Chief Complaint  Patient presents with   Coronary Artery Disease   New Patient (Initial Visit)     HPI  57 y.o.  female with a significant PMH but not limited to hyperlipidemia, obesity, and GERD. She was recently seen by her PCP and was concerned about her cholesterol levels still being elevated despite being compliant on atorvastatin 40 mg, as well as an elevated CT Cardiac Calcium score (results below). She states there is a family hx of vascular and heart disease. Her father had a MI s/p CABGx 3 and and the age of 66, and sister has aortic valve surgery as well at the age of 68, and brother had CAGB and AVR age 32.   Today she denies chest pain, shortness of breath, palpitations, leg edema, orthopnea, PND, TIA/syncope. She is accompanied by her husband, Tammy Herman, and has three kids. She is a former smoker of 25 years and stopped in 2008, and endorses drinking alcohol apporx 3-4 drinks a day, along with 1-2 cups of coffee a day. She does not exercise routinely due to knee pain. She states she has been on atorvastatin 40 mg for the past five years.    Past Medical History:  Diagnosis Date   Amenorrhea    Arthritis    Depression    GERD (gastroesophageal reflux disease)    Hyperlipidemia    PONV (postoperative nausea and vomiting)      Past Surgical History:  Procedure Laterality Date   BIOPSY  04/18/2021   Procedure: BIOPSY;  Surgeon: Yetta Flock, MD;  Location: [facility];  Service: Gastroenterology;;   BREAST REDUCTION SURGERY     CARPAL TUNNEL RELEASE Left    ESOPHAGOGASTRODUODENOSCOPY (EGD) WITH PROPOFOL N/A 04/18/2021   Procedure: ESOPHAGOGASTRODUODENOSCOPY (EGD) WITH PROPOFOL;  Surgeon: Yetta Flock, MD;  Location: [facility];  Service: Gastroenterology;  Laterality: N/A;    ESOPHAGOGASTRODUODENOSCOPY (EGD) WITH PROPOFOL N/A 06/12/2021   Procedure: ESOPHAGOGASTRODUODENOSCOPY (EGD) WITH PROPOFOL;  Surgeon: Yetta Flock, MD;  Location: [facility];  Service: Gastroenterology;  Laterality: N/A;   KNEE ARTHROSCOPY WITH LATERAL MENISECTOMY Right 10/18/2014   Procedure: RIGHT KNEE ARTHROSCOPY WITH LATERAL MENISECTOMY;  Surgeon: Mauri Pole, MD;  Location: [facility];  Service: Orthopedics;  Laterality: Right;   KNEE SURGERY Left 08/2012   ORIF HUMERUS FRACTURE Left 02/28/2021   Procedure: OPEN REDUCTION INTERNAL FIXATION (ORIF) PROXIMAL HUMERUS FRACTURE;  Surgeon: Nicholes Stairs, MD;  Location: [facility];  Service: Orthopedics;  Laterality: Left;   PARTIAL KNEE ARTHROPLASTY Right 10/18/2014   Procedure: RIGHT MEDIAL COMPARTMENTAL UNI-KNEE ARTHROPLASTY;  Surgeon: Mauri Pole, MD;  Location: [facility];  Service: Orthopedics;  Laterality: Right;   TOTAL KNEE ARTHROPLASTY Left 06/20/2015   Procedure: TOTAL LEFT KNEE ARTHROPLASTY;  Surgeon: Paralee Cancel, MD;  Location: [facility];  Service: Orthopedics;  Laterality: Left;   TUBAL LIGATION  2001     Social History   Tobacco Use  Smoking Status Former   Packs/day: 1.00   Years: 20.00   Additional pack years: 0.00   Total pack years: 20.00   Types: Cigarettes   Quit date: 07/27/2006   Years since quitting: 16.4  Smokeless Tobacco Never    Social History   Substance and Sexual Activity  Alcohol Use Yes   Alcohol/week: 7.0 standard drinks of alcohol  Types: 7 Glasses of wine per week   Comment: 1-2 glasses wine nightly per pt     Family History  Problem Relation Age of Onset   Hypertension Mother    Heart attack Father    Diabetes Father        Type 2   Heart disease Sister    Diabetes Sister        Type 2   Heart disease Brother    Breast cancer Maternal Aunt    Colon cancer Neg Hx    Stomach cancer Neg Hx    Esophageal cancer Neg Hx    Rectal cancer Neg Hx       Current Outpatient  Medications:    acetaminophen (TYLENOL) 500 MG tablet, Take 1,000 mg by mouth every 6 (six) hours as needed for moderate pain or headache., Disp: , Rfl:    atorvastatin (LIPITOR) 40 MG tablet, Take 40 mg by mouth daily., Disp: , Rfl:    ezetimibe (ZETIA) 10 MG tablet, Take 1 tablet (10 mg total) by mouth daily., Disp: 90 tablet, Rfl: 3   methocarbamol (ROBAXIN) 500 MG tablet, Take 1 tablet (500 mg total) by mouth every 6 (six) hours as needed for muscle spasms., Disp: 45 tablet, Rfl: 1   pantoprazole (PROTONIX) 40 MG tablet, Take 1 tablet (40 mg total) by mouth 2 (two) times daily. (Patient taking differently: Take 40 mg by mouth See admin instructions. Take 40 mg daily, may take a second 40 mg dose as needed for acid reflux), Disp: 180 tablet, Rfl: 3   traMADol (ULTRAM) 50 MG tablet, Take 100 mg by mouth daily at 12 noon., Disp: , Rfl:    traZODone (DESYREL) 100 MG tablet, Take 200 mg by mouth at bedtime., Disp: , Rfl:    Turmeric (QC TUMERIC COMPLEX PO), Take by mouth., Disp: , Rfl:    venlafaxine (EFFEXOR) 75 MG tablet, Take 150 mg by mouth daily., Disp: , Rfl:    Cardiovascular and other pertinent studies:  CT CARDIAC CORONARY ARTERY CALCIUM SCORE 12/13/2022   Left Main: 0 LAD: 438 LCx: 5.5 RCA: 421 Total Agatston Score: 865  IMPRESSION: Severe three-vessel coronary atherosclerotic calcifications. The observed calcium score of 865 is at the ninety-ninth percentile for subjects of the same age, gender, and race/ethnicity who are free of clinical cardiovascular disease and treated diabetes.  Echocardiogram 12/10/2022: Left ventricle cavity is normal in size and wall thickness. Normal global wall motion. Normal LV systolic function with EF 63%. Normal diastolic filling pattern. Trace regurgitation. No evidence of pulmonary hypertension.    Reviewed external labs and tests, independently interpreted   EKG 12/19/2022: Sinus tachycardia 103 bpm Normal EKG    Recent  labs: 02/12/2022024: Glucose 76, BUN/Cr 11/0.9. EGFR 64.5. Na/K 139/4.2. Rest of the CMP normal H/H 13.3/37.7. MCV 89.7. Platelets 303 Chol 255, TG 213, HDL 92, LDL 120 TSH 1.24 normal   Review of Systems  Constitutional: Negative.  Cardiovascular:  Negative for chest pain, claudication, cyanosis, dyspnea on exertion, leg swelling, orthopnea, palpitations, paroxysmal nocturnal dyspnea and syncope.  Respiratory: Negative.    Musculoskeletal: Negative.   Neurological: Negative.   Psychiatric/Behavioral: Negative.          Vitals:   12/19/22 0835  BP: 125/89  Pulse: (!) 109  Resp: 16  SpO2: 97%     Body mass index is 35.86 kg/m. Filed Weights   12/19/22 0835  Weight: 100.8 kg     Objective:   Physical Exam Cardiovascular:  Rate and Rhythm: Tachycardia present.     Pulses: Normal pulses.     Heart sounds: Normal heart sounds. No murmur heard.    No gallop.  Pulmonary:     Effort: Pulmonary effort is normal.     Breath sounds: Normal breath sounds.  Abdominal:     General: Bowel sounds are normal.     Palpations: Abdomen is soft.  Musculoskeletal:     Right lower leg: No edema.     Left lower leg: No edema.  Neurological:     General: No focal deficit present.     Mental Status: She is alert.  Psychiatric:        Mood and Affect: Mood normal.          Visit diagnoses:   ICD-10-CM   1. Mixed hyperlipidemia  E78.2 PCV MYOCARDIAL PERFUSION WITH LEXISCAN    Lipid Profile    2. Elevated coronary artery calcium score  R93.1 EKG 12-Lead    PCV MYOCARDIAL PERFUSION WITH LEXISCAN    3. Sinus tachycardia  R00.0        Orders Placed This Encounter  Procedures   Lipid Profile   PCV MYOCARDIAL PERFUSION WITH LEXISCAN   EKG 12-Lead     Medication changes this visit: There are no discontinued medications.  Meds ordered this encounter  Medications   ezetimibe (ZETIA) 10 MG tablet    Sig: Take 1 tablet (10 mg total) by mouth daily.    Dispense:  90  tablet    Refill:  3     Assessment & Recommendations:   57 y.o.  female with a significant PMH but not limited to hyperlipidemia, obesity, and GERD. She was recently seen by her PCP and was concerned about her cholesterol levels still being elevated despite being compliant on atorvastatin 40 mg, as well as an elevated CT Cardiac Calcium score (865). She states there is a family hx of vascular and heart disease. Her father had a MI s/p CABGx 3 and and the age of 104, and sister has aortic valve surgery as well at the age of 68, and brother had CAGB and AVR age 18.   70. Mixed hyperlipidemia Personally reviewed outside labs Plan  - Continue atorvastatin 40 mg PO daily - Start Zetia 10 mg PO daily  - Repeat Lipids in 6 weeks   2. Elevated coronary artery calcium score Personally reviewed, Total Agatston Score: 865 Plan - Schedule Cardiac Stress Test with pharm - Continue taking medications as prescribed - Encouraged heart health diet, with limitation to fried foods - Encouraged weight loss and exercise   3. Sinus Tachycardia EKG personally reviewed, likely related to stress from appointment today. Will continue to monitor.  Plan - Encouraged heart health diet, with limitation to fried foods - Encouraged weight loss and exercise  - Continue to monitor HR with Apple Watch  Follow up in 6 weeks.   Thank you for referring the patient to [place]. Please feel free to contact with any questions.   Nigel Mormon, MD Pager: [phone] Office: [phone]

## 2022-12-24 ENCOUNTER — Ambulatory Visit: Payer: 59

## 2022-12-24 DIAGNOSIS — R931 Abnormal findings on diagnostic imaging of heart and coronary circulation: Secondary | ICD-10-CM

## 2022-12-24 DIAGNOSIS — E782 Mixed hyperlipidemia: Secondary | ICD-10-CM

## 2023-02-01 ENCOUNTER — Other Ambulatory Visit (HOSPITAL_COMMUNITY): Payer: Self-pay | Admitting: Cardiology

## 2023-02-02 LAB — LIPID PANEL
Chol/HDL Ratio: 2.8 ratio (ref 0.0–4.4)
Cholesterol, Total: 257 mg/dL — ABNORMAL HIGH (ref 100–199)
HDL: 92 mg/dL (ref >39)
LDL Chol Calc (NIH): 137 mg/dL — ABNORMAL HIGH (ref 0–99)
Triglycerides: 163 mg/dL — ABNORMAL HIGH (ref 0–149)
VLDL Cholesterol Cal: 28 mg/dL (ref 5–40)

## 2023-02-06 ENCOUNTER — Encounter: Payer: Self-pay | Admitting: Cardiology

## 2023-02-06 ENCOUNTER — Ambulatory Visit: Payer: 59 | Admitting: Cardiology

## 2023-02-06 VITALS — BP 132/89 | HR 101 | Ht 66.0 in | Wt 226.2 lb

## 2023-02-06 DIAGNOSIS — I25118 Atherosclerotic heart disease of native coronary artery with other forms of angina pectoris: Secondary | ICD-10-CM

## 2023-02-06 DIAGNOSIS — R931 Abnormal findings on diagnostic imaging of heart and coronary circulation: Secondary | ICD-10-CM

## 2023-02-06 DIAGNOSIS — I2510 Atherosclerotic heart disease of native coronary artery without angina pectoris: Secondary | ICD-10-CM | POA: Insufficient documentation

## 2023-02-06 DIAGNOSIS — E782 Mixed hyperlipidemia: Secondary | ICD-10-CM

## 2023-02-06 DIAGNOSIS — R0609 Other forms of dyspnea: Secondary | ICD-10-CM

## 2023-02-06 MED ORDER — NITROGLYCERIN 0.4 MG SL SUBL: 0.4000 mg | SUBLINGUAL_TABLET | SUBLINGUAL | 3 refills | Status: AC | PRN

## 2023-02-06 MED ORDER — ASPIRIN 81 MG PO TBEC: 81.0000 mg | DELAYED_RELEASE_TABLET | Freq: Every day | ORAL | 3 refills | Status: DC

## 2023-02-06 MED ORDER — REPATHA SURECLICK 140 MG/ML ~~LOC~~ SOAJ: 140.0000 mg | SUBCUTANEOUS | 5 refills | Status: AC

## 2023-02-06 MED ORDER — METOPROLOL SUCCINATE ER 50 MG PO TB24: 50.0000 mg | ORAL_TABLET | Freq: Every day | ORAL | 3 refills | Status: AC

## 2023-02-06 NOTE — Progress Notes (Signed)
Patient referred by Chilton Greathouse, MD for Coronary atherosclerosis   Subjective:   Tammy Herman, female    DOB: 1965/10/18, 57 y.o.   MRN: [id number]   Chief Complaint  Patient presents with   Agatston coronary artery calcium score greater than 400   Follow-up   Results     HPI  57 year old Caucasian female with mixed hyperlipidemia, elevated CAC, family history of CAD   Recently, patient has noticed dyspnea with minimal activity such as doing house chores.  She is also noticed that her heart rate stays elevated.  She denies any specific chest pain, chest pressure symptoms. Reviewed recent test results with the patient, details below.       Current Outpatient Medications:    acetaminophen (TYLENOL) 500 MG tablet, Take 1,000 mg by mouth every 6 (six) hours as needed for moderate pain or headache., Disp: , Rfl:    atorvastatin (LIPITOR) 40 MG tablet, Take 40 mg by mouth daily., Disp: , Rfl:    ezetimibe (ZETIA) 10 MG tablet, Take 1 tablet (10 mg total) by mouth daily., Disp: 90 tablet, Rfl: 3   methocarbamol (ROBAXIN) 500 MG tablet, Take 1 tablet (500 mg total) by mouth every 6 (six) hours as needed for muscle spasms., Disp: 45 tablet, Rfl: 1   pantoprazole (PROTONIX) 40 MG tablet, Take 1 tablet (40 mg total) by mouth 2 (two) times daily. (Patient taking differently: Take 40 mg by mouth See admin instructions. Take 40 mg daily, may take a second 40 mg dose as needed for acid reflux), Disp: 180 tablet, Rfl: 3   traMADol (ULTRAM) 50 MG tablet, Take 100 mg by mouth daily at 12 noon., Disp: , Rfl:    traZODone (DESYREL) 100 MG tablet, Take 200 mg by mouth at bedtime., Disp: , Rfl:    Turmeric (QC TUMERIC COMPLEX PO), Take by mouth., Disp: , Rfl:    venlafaxine (EFFEXOR) 75 MG tablet, Take 150 mg by mouth daily., Disp: , Rfl:    Cardiovascular and other pertinent studies:  Reviewed external labs and tests, independently interpreted  EKG 12/19/2022: Sinus tachycardia 103  bpm Normal EKG  CT cardiac scoring 12/13/2022: Left Main: 0 LAD: 438 LCx: 5.5 RCA: 421  Total Agatston Score: 865 (95th percentile)  Echocardiogram 12/10/2022: Left ventricle cavity is normal in size and wall thickness. Normal global wall motion. Normal LV systolic function with EF 63%. Normal diastolic filling pattern. Trace regurgitation. No evidence of pulmonary hypertension.   Regadenoson (with Mod Bruce protocol) Nuclear stress test 12/24/2022: Myocardial perfusion is normal. Overall LV systolic function is normal without regional wall motion abnormalities. Stress LV EF: 55%. Low risk study. Nondiagnostic ECG stress. The heart rate response was consistent with Regadenoson. The blood pressure response was physiologic. No previous exam available for comparison.   Recent labs: 02/01/2023: Chol 257, TG 163, HDL 92, LDL 137   11/05/2022: Glucose 76, BUN/Cr 11/0.9. EGFR 64.5. Na/K 139/4.2. Rest of the CMP normal H/H 13.3/37.7. MCV 89.7. Platelets 303 Chol 255, TG 213, HDL 92, LDL 120 TSH 1.24 normal   Review of Systems  Constitutional: Negative.  Cardiovascular:  Positive for dyspnea on exertion. Negative for chest pain, claudication, cyanosis, leg swelling, orthopnea, palpitations, paroxysmal nocturnal dyspnea and syncope.  Respiratory: Negative.    Musculoskeletal: Negative.   Neurological: Negative.   Psychiatric/Behavioral: Negative.          Vitals:   02/06/23 1112  BP: 132/89  Pulse: (!) 101     Body mass index  is 36.51 kg/m. Filed Weights   02/06/23 1112  Weight: 226 lb 3.2 oz (102.6 kg)     Objective:   Physical Exam Cardiovascular:     Rate and Rhythm: Tachycardia present.     Pulses: Normal pulses.     Heart sounds: Normal heart sounds. No murmur heard.    No gallop.  Pulmonary:     Effort: Pulmonary effort is normal.     Breath sounds: Normal breath sounds.  Abdominal:     General: Bowel sounds are normal.     Palpations: Abdomen is soft.   Musculoskeletal:     Right lower leg: No edema.     Left lower leg: No edema.  Neurological:     General: No focal deficit present.     Mental Status: She is alert.  Psychiatric:        Mood and Affect: Mood normal.          Visit diagnoses:   ICD-10-CM   1. Agatston coronary artery calcium score greater than 400  R93.1 Lipid panel    Lipid panel    2. Coronary artery disease involving native coronary artery of native heart with other form of angina pectoris (HCC)  I25.118     3. Exertional dyspnea  R06.09     4. Mixed hyperlipidemia  E78.2 CBC    Basic metabolic panel    Lipid panel    Lipid panel       Orders Placed This Encounter  Procedures   CBC   Basic metabolic panel   Lipid panel     Medication changes this visit: There are no discontinued medications.  Meds ordered this encounter  Medications   metoprolol succinate (TOPROL-XL) 50 MG 24 hr tablet    Sig: Take 1 tablet (50 mg total) by mouth daily. Take with or immediately following a meal.    Dispense:  90 tablet    Refill:  3   nitroGLYCERIN (NITROSTAT) 0.4 MG SL tablet    Sig: Place 1 tablet (0.4 mg total) under the tongue every 5 (five) minutes as needed for chest pain.    Dispense:  25 tablet    Refill:  3   Evolocumab (REPATHA SURECLICK) 140 MG/ML SOAJ    Sig: Inject 140 mg into the skin every 14 (fourteen) days.    Dispense:  6 mL    Refill:  5   aspirin EC 81 MG tablet    Sig: Take 1 tablet (81 mg total) by mouth daily. Swallow whole.    Dispense:  30 tablet    Refill:  3     Assessment & Recommendations:   57 year old Caucasian female with mixed hyperlipidemia, elevated CAC, family history of CAD, exertional dyspnea  Exertional dyspnea: Structurally normal heart, euvolemic on exam. Multivessel coronary calcium in 95th percentile. No ischemia on Lexiscan nuclear stress testing (12/2022).  However, given her relatively new symptoms of exertional dyspnea, and multivessel calcium,  balanced ischemia cannot be excluded. Recommend aspirin 81 mg daily for now.  Added metoprolol succinate 50 mg daily, and as needed sublingual intervention. See below regarding lipid management. In addition, it is reasonable to proceed with coronary angiography for definitive coronary artery evaluation.  CT angiogram would be suboptimal given high calcium can preclude visualization of coronaries. Discussed risks benefits, alternate options with the patient. She would like to proceed with coronary angiography, sometime in June 2024.  Mixed hyperlipidemia: Chol 257, TG 163, HDL 92, LDL 137 (01/2023) Chol 255, TG 213,  HDL 92, LDL 120 (10/2022). Currently on Lipitor 40 mg daily, Zetia 10 mg daily. Given her high calcium score, recommend addition of Repatha. Repeat lipid panel in 3 months.  F/u in 3 months    Elder Negus, MD Pager: [phone] Office: [phone]

## 2023-02-23 ENCOUNTER — Other Ambulatory Visit: Payer: Self-pay | Admitting: Cardiology

## 2023-12-17 ENCOUNTER — Other Ambulatory Visit (HOSPITAL_BASED_OUTPATIENT_CLINIC_OR_DEPARTMENT_OTHER): Payer: Self-pay

## 2023-12-17 MED ORDER — ZEPBOUND 12.5 MG/0.5ML ~~LOC~~ SOAJ
12.5000 mg | SUBCUTANEOUS | 3 refills | Status: DC
  Filled 2023-12-17: qty 2, 28d supply, fill #0
  Filled 2024-01-03 – 2024-01-10 (×2): qty 2, 28d supply, fill #1
  Filled 2024-02-07: qty 2, 28d supply, fill #2
  Filled 2024-03-06: qty 2, 28d supply, fill #3

## 2024-01-03 ENCOUNTER — Other Ambulatory Visit (HOSPITAL_BASED_OUTPATIENT_CLINIC_OR_DEPARTMENT_OTHER): Payer: Self-pay

## 2024-01-10 ENCOUNTER — Other Ambulatory Visit (HOSPITAL_BASED_OUTPATIENT_CLINIC_OR_DEPARTMENT_OTHER): Payer: Self-pay

## 2024-02-07 ENCOUNTER — Other Ambulatory Visit (HOSPITAL_BASED_OUTPATIENT_CLINIC_OR_DEPARTMENT_OTHER): Payer: Self-pay

## 2024-03-06 ENCOUNTER — Other Ambulatory Visit (HOSPITAL_BASED_OUTPATIENT_CLINIC_OR_DEPARTMENT_OTHER): Payer: Self-pay

## 2024-03-31 ENCOUNTER — Other Ambulatory Visit (HOSPITAL_BASED_OUTPATIENT_CLINIC_OR_DEPARTMENT_OTHER): Payer: Self-pay

## 2024-03-31 MED ORDER — ZEPBOUND 12.5 MG/0.5ML ~~LOC~~ SOAJ
12.5000 mg | SUBCUTANEOUS | 3 refills | Status: AC
  Filled 2024-03-31: qty 2, 28d supply, fill #0
  Filled 2024-04-27: qty 2, 28d supply, fill #1
  Filled 2024-05-28 – 2024-07-29 (×2): qty 2, 28d supply, fill #2

## 2024-04-27 ENCOUNTER — Other Ambulatory Visit (HOSPITAL_BASED_OUTPATIENT_CLINIC_OR_DEPARTMENT_OTHER): Payer: Self-pay

## 2024-05-28 ENCOUNTER — Other Ambulatory Visit (HOSPITAL_BASED_OUTPATIENT_CLINIC_OR_DEPARTMENT_OTHER): Payer: Self-pay

## 2024-06-01 ENCOUNTER — Other Ambulatory Visit (HOSPITAL_BASED_OUTPATIENT_CLINIC_OR_DEPARTMENT_OTHER): Payer: Self-pay

## 2024-06-01 MED ORDER — ZEPBOUND 15 MG/0.5ML ~~LOC~~ SOAJ
15.0000 mg | SUBCUTANEOUS | 11 refills | Status: AC
Start: 2024-06-01 — End: ?
  Filled 2024-06-01: qty 2, 28d supply, fill #0
  Filled 2024-06-18 – 2024-06-23 (×2): qty 2, 28d supply, fill #1
  Filled 2024-08-11 – 2024-09-07 (×2): qty 2, 28d supply, fill #2

## 2024-06-03 ENCOUNTER — Other Ambulatory Visit (HOSPITAL_BASED_OUTPATIENT_CLINIC_OR_DEPARTMENT_OTHER): Payer: Self-pay

## 2024-06-03 MED ORDER — REPATHA SURECLICK 140 MG/ML ~~LOC~~ SOAJ
140.0000 mg | SUBCUTANEOUS | 1 refills | Status: DC
  Filled 2024-06-03: qty 2, 28d supply, fill #0
  Filled 2024-06-18 – 2024-06-24 (×3): qty 2, 28d supply, fill #1

## 2024-06-18 ENCOUNTER — Other Ambulatory Visit (HOSPITAL_BASED_OUTPATIENT_CLINIC_OR_DEPARTMENT_OTHER): Payer: Self-pay

## 2024-06-23 ENCOUNTER — Other Ambulatory Visit (HOSPITAL_BASED_OUTPATIENT_CLINIC_OR_DEPARTMENT_OTHER): Payer: Self-pay

## 2024-06-23 ENCOUNTER — Other Ambulatory Visit: Payer: Self-pay

## 2024-06-30 ENCOUNTER — Other Ambulatory Visit (HOSPITAL_BASED_OUTPATIENT_CLINIC_OR_DEPARTMENT_OTHER): Payer: Self-pay

## 2024-07-22 ENCOUNTER — Other Ambulatory Visit (HOSPITAL_BASED_OUTPATIENT_CLINIC_OR_DEPARTMENT_OTHER): Payer: Self-pay

## 2024-07-22 MED ORDER — REPATHA SURECLICK 140 MG/ML ~~LOC~~ SOAJ
SUBCUTANEOUS | 1 refills | Status: DC
  Filled 2024-07-22: qty 2, 28d supply, fill #0
  Filled 2024-08-11 – 2024-08-13 (×2): qty 2, 28d supply, fill #1

## 2024-07-22 MED ORDER — ZEPBOUND 15 MG/0.5ML ~~LOC~~ SOAJ
15.0000 mg | SUBCUTANEOUS | 11 refills | Status: AC
  Filled 2024-07-22: qty 2, 28d supply, fill #0
  Filled 2024-08-17 – 2024-08-26 (×2): qty 2, 28d supply, fill #1
  Filled 2024-08-26 – 2024-09-16 (×3): qty 2, 28d supply, fill #2
  Filled 2024-10-05 – 2024-10-07 (×2): qty 2, 28d supply, fill #3
  Filled 2024-10-26 – 2024-10-27 (×2): qty 2, 28d supply, fill #4

## 2024-07-29 ENCOUNTER — Other Ambulatory Visit (HOSPITAL_BASED_OUTPATIENT_CLINIC_OR_DEPARTMENT_OTHER): Payer: Self-pay

## 2024-08-11 ENCOUNTER — Other Ambulatory Visit (HOSPITAL_BASED_OUTPATIENT_CLINIC_OR_DEPARTMENT_OTHER): Payer: Self-pay

## 2024-08-11 ENCOUNTER — Other Ambulatory Visit: Payer: Self-pay

## 2024-08-13 ENCOUNTER — Other Ambulatory Visit: Payer: Self-pay

## 2024-08-17 ENCOUNTER — Other Ambulatory Visit (HOSPITAL_BASED_OUTPATIENT_CLINIC_OR_DEPARTMENT_OTHER): Payer: Self-pay

## 2024-08-24 ENCOUNTER — Other Ambulatory Visit (HOSPITAL_BASED_OUTPATIENT_CLINIC_OR_DEPARTMENT_OTHER): Payer: Self-pay

## 2024-08-25 ENCOUNTER — Other Ambulatory Visit (HOSPITAL_BASED_OUTPATIENT_CLINIC_OR_DEPARTMENT_OTHER): Payer: Self-pay

## 2024-08-26 ENCOUNTER — Other Ambulatory Visit (HOSPITAL_BASED_OUTPATIENT_CLINIC_OR_DEPARTMENT_OTHER): Payer: Self-pay

## 2024-08-26 MED ORDER — REPATHA SURECLICK 140 MG/ML ~~LOC~~ SOAJ
140.0000 mg | SUBCUTANEOUS | 1 refills | Status: DC
  Filled 2024-08-26 – 2024-09-08 (×5): qty 2, 28d supply, fill #0
  Filled 2024-10-05: qty 2, 28d supply, fill #1
  Filled ????-??-??: fill #0

## 2024-08-27 ENCOUNTER — Other Ambulatory Visit (HOSPITAL_BASED_OUTPATIENT_CLINIC_OR_DEPARTMENT_OTHER): Payer: Self-pay

## 2024-08-30 ENCOUNTER — Other Ambulatory Visit: Payer: Self-pay

## 2024-08-31 ENCOUNTER — Other Ambulatory Visit (HOSPITAL_BASED_OUTPATIENT_CLINIC_OR_DEPARTMENT_OTHER): Payer: Self-pay

## 2024-09-04 ENCOUNTER — Other Ambulatory Visit: Payer: Self-pay

## 2024-09-07 ENCOUNTER — Other Ambulatory Visit (HOSPITAL_BASED_OUTPATIENT_CLINIC_OR_DEPARTMENT_OTHER): Payer: Self-pay

## 2024-09-08 ENCOUNTER — Other Ambulatory Visit (HOSPITAL_BASED_OUTPATIENT_CLINIC_OR_DEPARTMENT_OTHER): Payer: Self-pay

## 2024-09-16 ENCOUNTER — Other Ambulatory Visit (HOSPITAL_BASED_OUTPATIENT_CLINIC_OR_DEPARTMENT_OTHER): Payer: Self-pay

## 2024-09-19 ENCOUNTER — Other Ambulatory Visit (HOSPITAL_BASED_OUTPATIENT_CLINIC_OR_DEPARTMENT_OTHER): Payer: Self-pay

## 2024-10-05 ENCOUNTER — Other Ambulatory Visit: Payer: Self-pay

## 2024-10-05 ENCOUNTER — Other Ambulatory Visit (HOSPITAL_BASED_OUTPATIENT_CLINIC_OR_DEPARTMENT_OTHER): Payer: Self-pay

## 2024-10-08 ENCOUNTER — Other Ambulatory Visit (HOSPITAL_BASED_OUTPATIENT_CLINIC_OR_DEPARTMENT_OTHER): Payer: Self-pay

## 2024-10-08 MED ORDER — TRAMADOL HCL 50 MG PO TABS
50.0000 mg | ORAL_TABLET | Freq: Two times a day (BID) | ORAL | 1 refills | Status: AC
  Filled 2024-10-08: qty 14, 7d supply, fill #0

## 2024-10-12 ENCOUNTER — Encounter (HOSPITAL_BASED_OUTPATIENT_CLINIC_OR_DEPARTMENT_OTHER): Payer: Self-pay

## 2024-10-12 ENCOUNTER — Other Ambulatory Visit (HOSPITAL_BASED_OUTPATIENT_CLINIC_OR_DEPARTMENT_OTHER): Payer: Self-pay

## 2024-10-21 ENCOUNTER — Other Ambulatory Visit (HOSPITAL_BASED_OUTPATIENT_CLINIC_OR_DEPARTMENT_OTHER): Payer: Self-pay

## 2024-10-21 ENCOUNTER — Other Ambulatory Visit: Payer: Self-pay

## 2024-10-22 ENCOUNTER — Other Ambulatory Visit: Payer: Self-pay

## 2024-10-26 ENCOUNTER — Other Ambulatory Visit (HOSPITAL_BASED_OUTPATIENT_CLINIC_OR_DEPARTMENT_OTHER): Payer: Self-pay

## 2024-10-27 ENCOUNTER — Other Ambulatory Visit (HOSPITAL_BASED_OUTPATIENT_CLINIC_OR_DEPARTMENT_OTHER): Payer: Self-pay

## 2024-10-27 MED ORDER — REPATHA SURECLICK 140 MG/ML ~~LOC~~ SOAJ
140.0000 mg | SUBCUTANEOUS | 1 refills | Status: AC
  Filled 2024-10-27: qty 2, 28d supply, fill #0
